# Patient Record
Sex: Female | Born: 1980 | Race: Black or African American | Hispanic: No | Marital: Single | Smoking: Never smoker
Health system: Southern US, Community
[De-identification: ages and names within clinical notes are randomized; demographics above are authoritative.]

## PROBLEM LIST (undated history)

## (undated) DIAGNOSIS — D649 Anemia, unspecified: Secondary | ICD-10-CM

## (undated) DIAGNOSIS — I10 Essential (primary) hypertension: Secondary | ICD-10-CM

## (undated) HISTORY — DX: Anemia, unspecified: D64.9

## (undated) HISTORY — PX: NO PAST SURGERIES: SHX2092

---

## 2000-12-15 ENCOUNTER — Other Ambulatory Visit: Admission: RE | Admit: 2000-12-15 | Discharge: 2000-12-15 | Payer: Self-pay | Admitting: Obstetrics

## 2012-01-26 LAB — OB RESULTS CONSOLE ABO/RH

## 2012-01-26 LAB — OB RESULTS CONSOLE RUBELLA ANTIBODY, IGM: Rubella: IMMUNE

## 2012-01-26 LAB — OB RESULTS CONSOLE ANTIBODY SCREEN: Antibody Screen: NEGATIVE

## 2012-01-26 LAB — OB RESULTS CONSOLE GC/CHLAMYDIA: Gonorrhea: NEGATIVE

## 2012-05-09 LAB — OB RESULTS CONSOLE GBS: GBS: NEGATIVE

## 2012-06-04 ENCOUNTER — Inpatient Hospital Stay (HOSPITAL_COMMUNITY)
Admission: AD | Admit: 2012-06-04 | Discharge: 2012-06-04 | Disposition: A | Discharge status: Hospice | Payer: Medicaid Other | Source: Ambulatory Visit | Attending: Obstetrics & Gynecology | Admitting: Obstetrics & Gynecology

## 2012-06-04 ENCOUNTER — Inpatient Hospital Stay (HOSPITAL_COMMUNITY)
Admission: AD | Admit: 2012-06-04 | Discharge: 2012-06-07 | DRG: 765 | Disposition: A | Discharge status: Home / Self Care | Payer: Medicaid Other | Source: Ambulatory Visit | Attending: Obstetrics & Gynecology | Admitting: Obstetrics & Gynecology

## 2012-06-04 ENCOUNTER — Encounter (HOSPITAL_COMMUNITY): Payer: Self-pay

## 2012-06-04 DIAGNOSIS — O368390 Maternal care for abnormalities of the fetal heart rate or rhythm, unspecified trimester, not applicable or unspecified: Secondary | ICD-10-CM | POA: Insufficient documentation

## 2012-06-04 DIAGNOSIS — O34219 Maternal care for unspecified type scar from previous cesarean delivery: Secondary | ICD-10-CM

## 2012-06-04 DIAGNOSIS — O339 Maternal care for disproportion, unspecified: Secondary | ICD-10-CM | POA: Diagnosis present

## 2012-06-04 DIAGNOSIS — O1002 Pre-existing essential hypertension complicating childbirth: Secondary | ICD-10-CM | POA: Diagnosis present

## 2012-06-04 DIAGNOSIS — O330 Maternal care for disproportion due to deformity of maternal pelvic bones: Secondary | ICD-10-CM | POA: Diagnosis present

## 2012-06-04 HISTORY — DX: Essential (primary) hypertension: I10

## 2012-06-04 LAB — CBC
MCHC: 35.2 g/dL (ref 30.0–36.0)
MCV: 84.9 fL (ref 78.0–100.0)
Platelets: 216 10*3/uL (ref 150–400)
RDW: 13.1 % (ref 11.5–15.5)
WBC: 7.4 10*3/uL (ref 4.0–10.5)

## 2012-06-04 MED ORDER — LACTATED RINGERS IV SOLN
INTRAVENOUS | Status: DC
  Administered 2012-06-04 – 2012-06-05 (×4): via INTRAVENOUS

## 2012-06-04 MED ORDER — OXYTOCIN BOLUS FROM INFUSION: 500.0000 mL | INTRAVENOUS | Status: DC

## 2012-06-04 MED ORDER — IBUPROFEN 600 MG PO TABS: 600.0000 mg | ORAL_TABLET | Freq: Four times a day (QID) | ORAL | Status: DC | PRN

## 2012-06-04 MED ORDER — ONDANSETRON HCL 4 MG/2ML IJ SOLN: 4.0000 mg | Freq: Four times a day (QID) | INTRAMUSCULAR | Status: DC | PRN

## 2012-06-04 MED ORDER — OXYTOCIN 40 UNITS IN LACTATED RINGERS INFUSION - SIMPLE MED
62.5000 mL/h | INTRAVENOUS | Status: DC
  Filled 2012-06-04: qty 1000

## 2012-06-04 MED ORDER — LACTATED RINGERS IV SOLN
500.0000 mL | INTRAVENOUS | Status: DC | PRN
  Administered 2012-06-04 – 2012-06-05 (×2): 500 mL via INTRAVENOUS
  Administered 2012-06-05: 1000 mL via INTRAVENOUS

## 2012-06-04 MED ORDER — OXYCODONE-ACETAMINOPHEN 5-325 MG PO TABS: 1.0000 | ORAL_TABLET | ORAL | Status: DC | PRN

## 2012-06-04 MED ORDER — ACETAMINOPHEN 325 MG PO TABS: 650.0000 mg | ORAL_TABLET | ORAL | Status: DC | PRN

## 2012-06-04 MED ORDER — CITRIC ACID-SODIUM CITRATE 334-500 MG/5ML PO SOLN
30.0000 mL | ORAL | Status: DC | PRN
  Administered 2012-06-05: 30 mL via ORAL
  Filled 2012-06-04: qty 15

## 2012-06-04 MED ORDER — LABETALOL HCL 200 MG PO TABS
200.0000 mg | ORAL_TABLET | Freq: Two times a day (BID) | ORAL | Status: DC
  Administered 2012-06-04 – 2012-06-05 (×2): 200 mg via ORAL
  Filled 2012-06-04 (×4): qty 1

## 2012-06-04 MED ORDER — LIDOCAINE HCL (PF) 1 % IJ SOLN: 30.0000 mL | INTRAMUSCULAR | Status: DC | PRN

## 2012-06-04 MED ORDER — NALBUPHINE SYRINGE 5 MG/0.5 ML: 10.0000 mg | INJECTION | INTRAMUSCULAR | Status: DC | PRN

## 2012-06-04 NOTE — MAU Note (Signed)
Patient was seen in the office and sent to MAU for continued monitoring. Had a nonreactive NST in the office. Patient reports fetal movement, no bleeding, contractions or leaking.

## 2012-06-04 NOTE — H&P (Signed)
Pt is a 31 year old black female, G1P0, at [redacted] weeks EGA who presents to L&D for an induction secondary to chronic hypertension with a minimally reactive NST.  Pt did have a BPP of 8/8 today. Pt had late [facility]. She has been on Labetalol 200mg  BID.GBS-, Normal -OGTT PMHX- see [facility] PE: 160/82    HEENT-wnl  ABD- gravid, palp contractions  cX-50/4/-2 VTX   IMP/ IUP at term         Chronic HTN         Susp. NST PLAN/ Admit            Start Induction

## 2012-06-04 NOTE — MAU Note (Signed)
Pt had 8/8 BPP  This afternoon and was sent for additional monitoring

## 2012-06-04 NOTE — MAU Note (Deleted)
Patient sent from the office for monitoring and Betamethasone injections. Patient states she was 3cm in the office. Patient states she has been having some mild "Braxton Hicks" contractions and some back pain, Reports good fetal movement, no bleeding or leaking

## 2012-06-05 ENCOUNTER — Encounter (HOSPITAL_COMMUNITY): Payer: Self-pay | Admitting: *Deleted

## 2012-06-05 ENCOUNTER — Inpatient Hospital Stay (HOSPITAL_COMMUNITY): Payer: Medicaid Other | Admitting: Anesthesiology

## 2012-06-05 ENCOUNTER — Encounter (HOSPITAL_COMMUNITY)
Admission: AD | Disposition: A | Discharge status: Home / Self Care | Payer: Self-pay | Source: Ambulatory Visit | Attending: Obstetrics & Gynecology

## 2012-06-05 ENCOUNTER — Encounter (HOSPITAL_COMMUNITY): Payer: Self-pay | Admitting: Anesthesiology

## 2012-06-05 LAB — TYPE AND SCREEN
ABO/RH(D): B NEG
Antibody Screen: NEGATIVE

## 2012-06-05 SURGERY — Surgical Case
Anesthesia: Epidural | Site: Abdomen | Wound class: Clean Contaminated

## 2012-06-05 MED ORDER — FENTANYL CITRATE 0.05 MG/ML IJ SOLN
INTRAMUSCULAR | Status: AC
  Administered 2012-06-05: 50 ug via INTRAVENOUS
  Filled 2012-06-05: qty 2

## 2012-06-05 MED ORDER — FENTANYL CITRATE 0.05 MG/ML IJ SOLN
25.0000 ug | INTRAMUSCULAR | Status: DC | PRN
  Administered 2012-06-05: 50 ug via INTRAVENOUS

## 2012-06-05 MED ORDER — OXYTOCIN 40 UNITS IN LACTATED RINGERS INFUSION - SIMPLE MED: 62.5000 mL/h | INTRAVENOUS | Status: AC

## 2012-06-05 MED ORDER — CEFAZOLIN SODIUM-DEXTROSE 2-3 GM-% IV SOLR
INTRAVENOUS | Status: AC
  Filled 2012-06-05: qty 50

## 2012-06-05 MED ORDER — NALBUPHINE HCL 10 MG/ML IJ SOLN
5.0000 mg | INTRAMUSCULAR | Status: DC | PRN
  Filled 2012-06-05: qty 1

## 2012-06-05 MED ORDER — DIPHENHYDRAMINE HCL 50 MG/ML IJ SOLN: 25.0000 mg | INTRAMUSCULAR | Status: DC | PRN

## 2012-06-05 MED ORDER — METOCLOPRAMIDE HCL 5 MG/ML IJ SOLN: 10.0000 mg | Freq: Three times a day (TID) | INTRAMUSCULAR | Status: DC | PRN

## 2012-06-05 MED ORDER — PHENYLEPHRINE HCL 10 MG/ML IJ SOLN
INTRAMUSCULAR | Status: DC | PRN
  Administered 2012-06-05 (×2): 80 ug via INTRAVENOUS
  Administered 2012-06-05: 120 ug via INTRAVENOUS
  Administered 2012-06-05: 80 ug via INTRAVENOUS

## 2012-06-05 MED ORDER — PHENYLEPHRINE 40 MCG/ML (10ML) SYRINGE FOR IV PUSH (FOR BLOOD PRESSURE SUPPORT)
PREFILLED_SYRINGE | INTRAVENOUS | Status: AC
  Filled 2012-06-05: qty 5

## 2012-06-05 MED ORDER — NALOXONE HCL 1 MG/ML IJ SOLN
1.0000 ug/kg/h | INTRAVENOUS | Status: DC | PRN
  Filled 2012-06-05: qty 2

## 2012-06-05 MED ORDER — SODIUM BICARBONATE 8.4 % IV SOLN
INTRAVENOUS | Status: AC
  Filled 2012-06-05: qty 50

## 2012-06-05 MED ORDER — DIPHENHYDRAMINE HCL 25 MG PO CAPS: 25.0000 mg | ORAL_CAPSULE | ORAL | Status: DC | PRN

## 2012-06-05 MED ORDER — LIDOCAINE-EPINEPHRINE (PF) 2 %-1:200000 IJ SOLN
INTRAMUSCULAR | Status: AC
  Filled 2012-06-05: qty 20

## 2012-06-05 MED ORDER — PRENATAL MULTIVITAMIN CH
1.0000 | ORAL_TABLET | Freq: Every day | ORAL | Status: DC
  Administered 2012-06-06 – 2012-06-07 (×2): 1 via ORAL
  Filled 2012-06-05 (×2): qty 1

## 2012-06-05 MED ORDER — SODIUM BICARBONATE 8.4 % IV SOLN
INTRAVENOUS | Status: DC | PRN
  Administered 2012-06-05: 5 mL via EPIDURAL

## 2012-06-05 MED ORDER — DIBUCAINE 1 % RE OINT: 1.0000 "application " | TOPICAL_OINTMENT | RECTAL | Status: DC | PRN

## 2012-06-05 MED ORDER — LIDOCAINE HCL (PF) 1 % IJ SOLN
INTRAMUSCULAR | Status: DC | PRN
  Administered 2012-06-05 (×4): 4 mL

## 2012-06-05 MED ORDER — MORPHINE SULFATE 0.5 MG/ML IJ SOLN
INTRAMUSCULAR | Status: AC
  Filled 2012-06-05: qty 10

## 2012-06-05 MED ORDER — TERBUTALINE SULFATE 1 MG/ML IJ SOLN: 0.2500 mg | Freq: Once | INTRAMUSCULAR | Status: DC | PRN

## 2012-06-05 MED ORDER — ZOLPIDEM TARTRATE 5 MG PO TABS: 5.0000 mg | ORAL_TABLET | Freq: Every evening | ORAL | Status: DC | PRN

## 2012-06-05 MED ORDER — MEASLES, MUMPS & RUBELLA VAC ~~LOC~~ INJ
0.5000 mL | INJECTION | Freq: Once | SUBCUTANEOUS | Status: DC
  Filled 2012-06-05: qty 0.5

## 2012-06-05 MED ORDER — EPHEDRINE 5 MG/ML INJ
10.0000 mg | INTRAVENOUS | Status: DC | PRN
  Filled 2012-06-05: qty 4

## 2012-06-05 MED ORDER — SENNOSIDES-DOCUSATE SODIUM 8.6-50 MG PO TABS
2.0000 | ORAL_TABLET | Freq: Every day | ORAL | Status: DC
  Administered 2012-06-05 – 2012-06-06 (×2): 2 via ORAL

## 2012-06-05 MED ORDER — KETOROLAC TROMETHAMINE 30 MG/ML IJ SOLN
30.0000 mg | Freq: Four times a day (QID) | INTRAMUSCULAR | Status: AC | PRN
  Administered 2012-06-05: 30 mg via INTRAVENOUS

## 2012-06-05 MED ORDER — 0.9 % SODIUM CHLORIDE (POUR BTL) OPTIME
TOPICAL | Status: DC | PRN
  Administered 2012-06-05: 1000 mL

## 2012-06-05 MED ORDER — LABETALOL HCL 200 MG PO TABS
200.0000 mg | ORAL_TABLET | Freq: Two times a day (BID) | ORAL | Status: DC
  Administered 2012-06-05 – 2012-06-06 (×3): 200 mg via ORAL
  Filled 2012-06-05 (×6): qty 1

## 2012-06-05 MED ORDER — KETOROLAC TROMETHAMINE 30 MG/ML IJ SOLN
INTRAMUSCULAR | Status: AC
  Filled 2012-06-05: qty 1

## 2012-06-05 MED ORDER — IBUPROFEN 600 MG PO TABS
600.0000 mg | ORAL_TABLET | Freq: Four times a day (QID) | ORAL | Status: DC
  Administered 2012-06-05 – 2012-06-07 (×6): 600 mg via ORAL

## 2012-06-05 MED ORDER — OXYTOCIN 40 UNITS IN LACTATED RINGERS INFUSION - SIMPLE MED
1.0000 m[IU]/min | INTRAVENOUS | Status: DC
  Administered 2012-06-05: 1 m[IU]/min via INTRAVENOUS

## 2012-06-05 MED ORDER — OXYCODONE-ACETAMINOPHEN 5-325 MG PO TABS
1.0000 | ORAL_TABLET | ORAL | Status: DC | PRN
  Administered 2012-06-07: 2 via ORAL
  Administered 2012-06-07: 1 via ORAL
  Filled 2012-06-05: qty 2
  Filled 2012-06-05: qty 1

## 2012-06-05 MED ORDER — TETANUS-DIPHTH-ACELL PERTUSSIS 5-2.5-18.5 LF-MCG/0.5 IM SUSP: 0.5000 mL | Freq: Once | INTRAMUSCULAR | Status: DC

## 2012-06-05 MED ORDER — NALOXONE HCL 0.4 MG/ML IJ SOLN: 0.4000 mg | INTRAMUSCULAR | Status: DC | PRN

## 2012-06-05 MED ORDER — LACTATED RINGERS IV SOLN
INTRAVENOUS | Status: DC
  Administered 2012-06-05: 20:00:00 via INTRAVENOUS

## 2012-06-05 MED ORDER — DIPHENHYDRAMINE HCL 50 MG/ML IJ SOLN: 12.5000 mg | INTRAMUSCULAR | Status: DC | PRN

## 2012-06-05 MED ORDER — LACTATED RINGERS IV SOLN
500.0000 mL | Freq: Once | INTRAVENOUS | Status: AC
  Administered 2012-06-05: 500 mL via INTRAVENOUS

## 2012-06-05 MED ORDER — ONDANSETRON HCL 4 MG PO TABS: 4.0000 mg | ORAL_TABLET | ORAL | Status: DC | PRN

## 2012-06-05 MED ORDER — PRENATAL MULTIVITAMIN CH: 1.0000 | ORAL_TABLET | Freq: Every day | ORAL | Status: DC

## 2012-06-05 MED ORDER — FENTANYL 2.5 MCG/ML BUPIVACAINE 1/10 % EPIDURAL INFUSION (WH - ANES)
14.0000 mL/h | INTRAMUSCULAR | Status: DC
  Administered 2012-06-05 (×2): 14 mL/h via EPIDURAL
  Filled 2012-06-05 (×2): qty 125

## 2012-06-05 MED ORDER — IBUPROFEN 600 MG PO TABS
600.0000 mg | ORAL_TABLET | Freq: Four times a day (QID) | ORAL | Status: DC | PRN
  Administered 2012-06-07: 600 mg via ORAL
  Filled 2012-06-05 (×7): qty 1

## 2012-06-05 MED ORDER — KETOROLAC TROMETHAMINE 30 MG/ML IJ SOLN: 30.0000 mg | Freq: Four times a day (QID) | INTRAMUSCULAR | Status: AC | PRN

## 2012-06-05 MED ORDER — PHENYLEPHRINE 40 MCG/ML (10ML) SYRINGE FOR IV PUSH (FOR BLOOD PRESSURE SUPPORT): 80.0000 ug | PREFILLED_SYRINGE | INTRAVENOUS | Status: DC | PRN

## 2012-06-05 MED ORDER — WITCH HAZEL-GLYCERIN EX PADS: 1.0000 "application " | MEDICATED_PAD | CUTANEOUS | Status: DC | PRN

## 2012-06-05 MED ORDER — ONDANSETRON HCL 4 MG/2ML IJ SOLN
4.0000 mg | Freq: Three times a day (TID) | INTRAMUSCULAR | Status: DC | PRN
  Filled 2012-06-05: qty 2

## 2012-06-05 MED ORDER — MEPERIDINE HCL 25 MG/ML IJ SOLN: 6.2500 mg | INTRAMUSCULAR | Status: DC | PRN

## 2012-06-05 MED ORDER — ONDANSETRON HCL 4 MG/2ML IJ SOLN
4.0000 mg | INTRAMUSCULAR | Status: DC | PRN
  Administered 2012-06-05: 4 mg via INTRAVENOUS

## 2012-06-05 MED ORDER — ONDANSETRON HCL 4 MG/2ML IJ SOLN
INTRAMUSCULAR | Status: DC | PRN
  Administered 2012-06-05: 4 mg via INTRAVENOUS

## 2012-06-05 MED ORDER — SODIUM CHLORIDE 0.9 % IJ SOLN: 3.0000 mL | INTRAMUSCULAR | Status: DC | PRN

## 2012-06-05 MED ORDER — CEFAZOLIN SODIUM-DEXTROSE 2-3 GM-% IV SOLR
INTRAVENOUS | Status: DC | PRN
  Administered 2012-06-05: 2 g via INTRAVENOUS

## 2012-06-05 MED ORDER — SCOPOLAMINE 1 MG/3DAYS TD PT72: 1.0000 | MEDICATED_PATCH | Freq: Once | TRANSDERMAL | Status: DC

## 2012-06-05 MED ORDER — MORPHINE SULFATE (PF) 0.5 MG/ML IJ SOLN
INTRAMUSCULAR | Status: DC | PRN
  Administered 2012-06-05: 3 mg via EPIDURAL

## 2012-06-05 MED ORDER — OXYTOCIN 10 UNIT/ML IJ SOLN
40.0000 [IU] | INTRAVENOUS | Status: DC | PRN
  Administered 2012-06-05: 40 [IU] via INTRAVENOUS

## 2012-06-05 MED ORDER — EPHEDRINE 5 MG/ML INJ: 10.0000 mg | INTRAVENOUS | Status: DC | PRN

## 2012-06-05 MED ORDER — PHENYLEPHRINE 40 MCG/ML (10ML) SYRINGE FOR IV PUSH (FOR BLOOD PRESSURE SUPPORT)
80.0000 ug | PREFILLED_SYRINGE | INTRAVENOUS | Status: DC | PRN
  Filled 2012-06-05: qty 5

## 2012-06-05 MED ORDER — RHO D IMMUNE GLOBULIN 1500 UNIT/2ML IJ SOLN
300.0000 ug | Freq: Once | INTRAMUSCULAR | Status: DC
Start: 2012-06-06 — End: 2012-06-07
  Filled 2012-06-05: qty 2

## 2012-06-05 MED ORDER — SCOPOLAMINE 1 MG/3DAYS TD PT72
MEDICATED_PATCH | TRANSDERMAL | Status: AC
  Administered 2012-06-05: 1.5 mg via TRANSDERMAL
  Filled 2012-06-05: qty 1

## 2012-06-05 SURGICAL SUPPLY — 29 items
CLOTH BEACON ORANGE TIMEOUT ST (SAFETY) ×2 IMPLANT
CONTAINER PREFILL 10% NBF 15ML (MISCELLANEOUS) IMPLANT
DRSG COVADERM 4X10 (GAUZE/BANDAGES/DRESSINGS) ×1 IMPLANT
DURAPREP 26ML APPLICATOR (WOUND CARE) ×2 IMPLANT
ELECT REM PT RETURN 9FT ADLT (ELECTROSURGICAL) ×2
ELECTRODE REM PT RTRN 9FT ADLT (ELECTROSURGICAL) ×1 IMPLANT
EXTRACTOR VACUUM M CUP 4 TUBE (SUCTIONS) IMPLANT
GLOVE ECLIPSE 7.0 STRL STRAW (GLOVE) ×4 IMPLANT
GOWN PREVENTION PLUS LG XLONG (DISPOSABLE) ×2 IMPLANT
GOWN PREVENTION PLUS XLARGE (GOWN DISPOSABLE) ×2 IMPLANT
KIT ABG SYR 3ML LUER SLIP (SYRINGE) IMPLANT
NDL HYPO 25X5/8 SAFETYGLIDE (NEEDLE) IMPLANT
NEEDLE HYPO 25X5/8 SAFETYGLIDE (NEEDLE) IMPLANT
NS IRRIG 1000ML POUR BTL (IV SOLUTION) ×2 IMPLANT
PACK C SECTION WH (CUSTOM PROCEDURE TRAY) ×2 IMPLANT
PAD OB MATERNITY 4.3X12.25 (PERSONAL CARE ITEMS) ×1 IMPLANT
RETRACTOR WND ALEXIS 25 LRG (MISCELLANEOUS) IMPLANT
RETRACTOR WOUND ALXS 34CM XLRG (MISCELLANEOUS) IMPLANT
RTRCTR WOUND ALEXIS 25CM LRG (MISCELLANEOUS)
RTRCTR WOUND ALEXIS 34CM XLRG (MISCELLANEOUS)
SLEEVE SCD COMPRESS KNEE MED (MISCELLANEOUS) ×1 IMPLANT
STAPLER VISISTAT 35W (STAPLE) IMPLANT
SUT MNCRL 0 VIOLET CTX 36 (SUTURE) ×3 IMPLANT
SUT MON AB 2-0 CT1 27 (SUTURE) ×4 IMPLANT
SUT MONOCRYL 0 CTX 36 (SUTURE) ×4
SUT PLAIN 0 NONE (SUTURE) IMPLANT
TOWEL OR 17X24 6PK STRL BLUE (TOWEL DISPOSABLE) ×4 IMPLANT
TRAY FOLEY CATH 14FR (SET/KITS/TRAYS/PACK) IMPLANT
WATER STERILE IRR 1000ML POUR (IV SOLUTION) ×1 IMPLANT

## 2012-06-05 NOTE — Consult Note (Addendum)
Neonatology Note:  Attendance at C-section:  I was asked to attend this primary C/S at term due to NRFHR. The mother is a G1P0 B neg, GBS neg with late PNC, chronic HTN on labetalol, a minimally-reactive NST, and some FHR decelerations during labor.. ROM 16 hours prior to delivery, fluid clear. Infant vigorous with good spontaneous cry and tone. Needed only minimal bulb suctioning. Ap 8/9. Lungs clear to ausc in DR. To CN to care of Pediatrician.  Jorell Agne, MD  

## 2012-06-05 NOTE — Progress Notes (Signed)
Pt has been pushing for . No change in station, still -3. Occ late decel. Resolved with poistion changes and O2. Will recheck in 1 hour unless there are FHT issues.

## 2012-06-05 NOTE — Anesthesia Postprocedure Evaluation (Signed)
  Anesthesia Post-op Note  Patient: [name]  Procedure(s) Performed: Procedure(s) (LRB) with comments: CESAREAN SECTION (N/A)  Patient Location: PACU  Anesthesia Type:Epidural  Level of Consciousness: awake, alert  and oriented  Airway and Oxygen Therapy: Patient Spontanous Breathing  Post-op Pain: none  Post-op Assessment: Post-op Vital signs reviewed, Patient's Cardiovascular Status Stable, Respiratory Function Stable, Patent Airway, No signs of Nausea or vomiting, Pain level controlled, No headache, No backache, No residual numbness and No residual motor weakness  Post-op Vital Signs: Reviewed and stable  Complications: No apparent anesthesia complications

## 2012-06-05 NOTE — Transfer of Care (Signed)
Immediate Anesthesia Transfer of Care Note  Patient: [name]  Procedure(s) Performed: Procedure(s) (LRB) with comments: CESAREAN SECTION (N/A)  Patient Location: PACU  Anesthesia Type:Spinal  Level of Consciousness: awake, alert  and oriented  Airway & Oxygen Therapy: Patient Spontanous Breathing  Post-op Assessment: Report given to PACU RN and Post -op Vital signs reviewed and stable  Post vital signs: stable  Complications: No apparent anesthesia complications

## 2012-06-05 NOTE — Progress Notes (Signed)
Pt has an epidural. Has no complaints.  She is now 9cm. FHTs- minimal variability,still reassuring, no change since admission, no decels PLAN/ will recheck in 1 hour.

## 2012-06-05 NOTE — Anesthesia Procedure Notes (Signed)
Epidural Patient location during procedure: OB Start time: 06/05/2012 1:34 AM  Staffing Performed by: anesthesiologist   Preanesthetic Checklist Completed: patient identified, site marked, surgical consent, pre-op evaluation, timeout performed, IV checked, risks and benefits discussed and monitors and equipment checked  Epidural Patient position: sitting Prep: site prepped and draped and DuraPrep Patient monitoring: continuous pulse ox and blood pressure Approach: midline Injection technique: LOR air  Needle:  Needle type: Tuohy  Needle gauge: 17 G Needle length: 9 cm and 9 Needle insertion depth: 7 cm Catheter type: closed end flexible Catheter size: 19 Gauge Catheter at skin depth: 12 cm Test dose: negative  Assessment Events: blood not aspirated, injection not painful, no injection resistance, negative IV test and no paresthesia  Additional Notes Discussed risk of headache, infection, bleeding, nerve injury and failed or incomplete block.  Patient voices understanding and wishes to proceed. Reason for block:procedure for pain

## 2012-06-05 NOTE — Anesthesia Preprocedure Evaluation (Addendum)
Anesthesia Evaluation  Patient identified by MRN, date of birth, ID band Patient awake    Reviewed: Allergy & Precautions, H&P , NPO status , Patient's Chart, lab work & pertinent test results, reviewed documented beta blocker date and time   History of Anesthesia Complications Negative for: history of anesthetic complications  Airway Mallampati: III TM Distance: >3 FB Neck ROM: full    Dental  (+) Teeth Intact   Pulmonary neg pulmonary ROS,  breath sounds clear to auscultation        Cardiovascular hypertension, On Home Beta Blockers Rhythm:regular Rate:Normal     Neuro/Psych negative neurological ROS  negative psych ROS   GI/Hepatic negative GI ROS, Neg liver ROS,   Endo/Other  Morbid obesity  Renal/GU negative Renal ROS     Musculoskeletal   Abdominal   Peds  Hematology negative hematology ROS (+)   Anesthesia Other Findings   Reproductive/Obstetrics (+) Pregnancy                           Anesthesia Physical Anesthesia Plan  ASA: III and emergent  Anesthesia Plan: Epidural   Post-op Pain Management:    Induction:   Airway Management Planned:   Additional Equipment:   Intra-op Plan:   Post-operative Plan:   Informed Consent: I have reviewed the patients History and Physical, chart, labs and discussed the procedure including the risks, benefits and alternatives for the proposed anesthesia with the patient or authorized representative who has indicated his/her understanding and acceptance.   Dental advisory given  Plan Discussed with: CRNA, Anesthesiologist and Surgeon  Anesthesia Plan Comments:        Anesthesia Quick Evaluation

## 2012-06-06 ENCOUNTER — Encounter (HOSPITAL_COMMUNITY): Payer: Self-pay | Admitting: Obstetrics and Gynecology

## 2012-06-06 LAB — CBC
HCT: 31.4 % — ABNORMAL LOW (ref 36.0–46.0)
MCH: 29.8 pg (ref 26.0–34.0)
MCV: 85.8 fL (ref 78.0–100.0)
Platelets: 191 10*3/uL (ref 150–400)
RBC: 3.66 MIL/uL — ABNORMAL LOW (ref 3.87–5.11)
RDW: 13.3 % (ref 11.5–15.5)
WBC: 15.4 10*3/uL — ABNORMAL HIGH (ref 4.0–10.5)

## 2012-06-06 MED ORDER — RHO D IMMUNE GLOBULIN 1500 UNIT/2ML IJ SOLN
300.0000 ug | Freq: Once | INTRAMUSCULAR | Status: AC
  Administered 2012-06-06: 300 ug via INTRAMUSCULAR
  Filled 2012-06-06: qty 2

## 2012-06-06 NOTE — Addendum Note (Signed)
Addendum  created 06/06/12 0937 by Natalyn Szymanowski S Bonniejean Piano, CRNA   Modules edited:Notes Section    

## 2012-06-06 NOTE — Progress Notes (Signed)
UR chart review completed.  

## 2012-06-06 NOTE — Progress Notes (Signed)
Late entry Patient is eating, ambulating.  Pain control is good.  Appropriate lochia.  No complaints.  Filed Vitals:   06/06/12 0545 06/06/12 1017 06/06/12 1345 06/06/12 1500  BP: 127/61 118/72 122/76 118/74  Pulse: 93 80 82 77  Temp: 98.4 F (36.9 C) 98.2 F (36.8 C) 98.4 F (36.9 C) 98.2 F (36.8 C)  TempSrc: Oral Oral    Resp: 18 20 20 18   Height:      Weight:      SpO2: 94% 97% 98% 97%    [employer]: c/d/i Ext: no CT Foley in place at time of exam.  Lab Results  Component Value Date   WBC 15.4* 06/06/2012   HGB 10.9* 06/06/2012   HCT 31.4* 06/06/2012   MCV 85.8 06/06/2012   PLT 191 06/06/2012    --/--/B NEG (11/20 0515)  A/P Post op day 1 s/p c/x for NRFHT Pt stable doing well.  Routine care.    Philip Aspen

## 2012-06-06 NOTE — Addendum Note (Signed)
Addendum  created 06/06/12 0933 by Elbert Ewings, CRNA   Modules edited:Notes Section

## 2012-06-06 NOTE — Op Note (Signed)
[name], [name]               ACCOUNT NO.:  [id number]  MEDICAL RECORD NO.:  [id number]  LOCATION:  9135                          FACILITY:  [facility]  PHYSICIAN:  Malva Limes, M.D.    DATE OF BIRTH:  [birth year]  DATE OF PROCEDURE: DATE OF DISCHARGE:                              OPERATIVE REPORT   PREOPERATIVE DIAGNOSES: 1. Intrauterine pregnancy at term. 2. Nonreassuring fetal heart rate tracing. 3. Cephalopelvic disproportion.  POSTOPERATIVE DIAGNOSES: 1. Intrauterine pregnancy at term. 2. Nonreassuring fetal heart rate tracing. 3. Cephalopelvic disproportion.  PROCEDURE:  Primary low transverse cesarean section.  SURGEON:  Malva Limes, M.D.  ANESTHESIA:  Epidural.  ANTIBIOTICS:  Ancef 2 g.  DRAINS:  Foley bedside drainage.  ESTIMATED BLOOD LOSS:  900 mL.  SPECIMENS:  Placenta sent to pathology.  COMPLICATIONS:  None.  FINDINGS:  The patient had normal fallopian tubes and ovaries bilaterally.  The uterus appeared to be normal.  The patient delivered 1 live viable black female infant in the vertex presentation.  The placenta appeared to be normal.  INDICATIONS:  [name] is a 31 year old, black female, who was admitted for induction at 39 weeks because of minimally reactive nonstress test the day prior to admission.  The patient's pregnancy was complicated by chronic hypertension.  She took labetalol 200 mg b.i.d. throughout the pregnancy.  On admission, the patient's cervix was 4 cm.  She underwent an amniotomy with clear fluid.  The patient completed the 1st stage of labor without complications.  Her tracing continued to have minimal variability and occasional late decelerations, but was still reassuring. The patient pushed for 2 hours and 15 minutes.  She failed to move the head beyond -2 station and the late decelerations were worsening.  In light of this, the patient was taken to the operating room for primary cesarean section.  DESCRIPTION OF  PROCEDURE:  The patient was taken to the operating room where epidural anesthetic was reinjected.  Once adequate level was reached, she was prepped and draped in the usual fashion for this procedure.  A Foley catheter had been placed in her bladder.  A Pfannenstiel incision was made 2 cm above the pubic symphysis.  On entering the abdominal cavity, the bladder flap was taken down with sharp dissection.  A low-transverse uterine incision was made in the midline and extended laterally with Metzenbaum scissors.  Amniotic fluid was noted to be clear.  The infant was delivered in the vertex presentation.  On delivery of the head, the oropharynx and nostrils were bulb suctioned.  The remaining infant was then delivered.  The cord was doubly clamped and cut and the infant handed to the awaiting NICU team. Placenta was then manually removed.  The uterus was exteriorized.  The uterine cavity was cleaned and wiped the wet lap and inspected.  The uterine incision was closed in a single layer of 0 Monocryl suture in a running, locking fashion.  The bladder flap was closed using 2-0 Monocryl in a running fashion.  Hemostasis was checked and felt to be adequate.  Uterus was placed back in the abdominal cavity.  The parietal peritoneum and rectus muscles were approximated midline using 2-0 Monocryl in  a running fashion.  The fascia was closed using 0 Monocryl suture in a running fashion.  Subcuticular tissue was made hemostatic with the Bovie.  Stainless steel clips were used to close the skin.  The patient tolerated the procedure well.  She was taken to recovery room in stable condition.  Instrument and lap count was correct x3.          ______________________________ Malva Limes, M.D.     MA/MEDQ  D:  06/05/2012  T:  06/06/2012  Job:  [id number]

## 2012-06-06 NOTE — Anesthesia Postprocedure Evaluation (Signed)
  Anesthesia Post-op Note  Patient: [name]  Procedure(s) Performed: Procedure(s) (LRB) with comments: CESAREAN SECTION (N/A)  Patient Location: PACU and Mother/Baby  Anesthesia Type:Epidural  Level of Consciousness: awake, alert  and oriented  Airway and Oxygen Therapy: Patient Spontanous Breathing  Post-op Pain: mild  Post-op Assessment: Patient's Cardiovascular Status Stable, Respiratory Function Stable, No signs of Nausea or vomiting, Adequate PO intake and Pain level controlled  Post-op Vital Signs: stable  Complications: No apparent anesthesia complications

## 2012-06-07 MED ORDER — DOCUSATE SODIUM 100 MG PO CAPS: 100.0000 mg | ORAL_CAPSULE | Freq: Two times a day (BID) | ORAL | Status: DC

## 2012-06-07 MED ORDER — IBUPROFEN 600 MG PO TABS: 600.0000 mg | ORAL_TABLET | Freq: Four times a day (QID) | ORAL | Status: AC | PRN

## 2012-06-07 MED ORDER — OXYCODONE-ACETAMINOPHEN 5-325 MG PO TABS: 2.0000 | ORAL_TABLET | ORAL | Status: DC | PRN

## 2012-06-07 MED ORDER — LABETALOL HCL 100 MG PO TABS: 200.0000 mg | ORAL_TABLET | Freq: Two times a day (BID) | ORAL | Status: DC

## 2012-06-07 NOTE — Addendum Note (Signed)
Addendum  created 06/07/12 1100 by Orlie Pollen, CRNA   Modules edited:Notes Section

## 2012-06-07 NOTE — Anesthesia Postprocedure Evaluation (Signed)
  Anesthesia Post-op Note  Patient: [name]  Procedure(s) Performed: Procedure(s) (LRB) with comments: CESAREAN SECTION (N/A)  Patient Location: PACU and Mother/Baby  Anesthesia Type:MAC and Spinal  Level of Consciousness: awake, alert , oriented and patient cooperative  Airway and Oxygen Therapy: Patient Spontanous Breathing  Post-op Pain: none  Post-op Assessment: Post-op Vital signs reviewed and Patient's Cardiovascular Status Stable  Post-op Vital Signs: Reviewed and stable  Complications: No apparent anesthesia complications

## 2012-06-07 NOTE — Discharge Summary (Signed)
Obstetric Discharge Summary Reason for Admission: onset of labor Prenatal Procedures: NST and ultrasound Intrapartum Procedures: cesarean: low cervical, transverse Postpartum Procedures: none Complications-Operative and Postpartum: none Hemoglobin  Date Value Range Status  06/06/2012 10.9* 12.0 - 15.0 g/dL Final     HCT  Date Value Range Status  06/06/2012 31.4* 36.0 - 46.0 % Final    Physical Exam:  General: alert, cooperative and appears stated age 31: appropriate Uterine Fundus: firm Incision: healing well DVT Evaluation: No evidence of DVT seen on physical exam.  Discharge Diagnoses: Term Pregnancy-delivered and Chronic Hypertension  Discharge Information: Date: 06/07/2012 Activity: pelvic rest Diet: routine Medications: Ibuprofen, Colace, Percocet and Labetalol Condition: improved Instructions: refer to practice specific booklet Discharge to: home Follow-up Information    Follow up with Levi Aland, MD. Schedule an appointment as soon as possible for a visit in 4 weeks. (For a post-operative evaluation)    Contact information:   [address] [place] [address] [phone]          Newborn Data: Live born female  Birth Weight: 7 lb 7.8 oz (3395 g) APGAR: 8, 9  Home with mother.  Katrina Poland H. 06/07/2012, 3:25 PM

## 2012-06-08 LAB — RH IG WORKUP (INCLUDES ABO/RH)
Fetal Screen: NEGATIVE
Unit division: 0

## 2014-05-19 ENCOUNTER — Encounter (HOSPITAL_COMMUNITY): Payer: Self-pay | Admitting: Obstetrics and Gynecology

## 2014-10-17 ENCOUNTER — Emergency Department (HOSPITAL_COMMUNITY)
Admission: EM | Admit: 2014-10-17 | Discharge: 2014-10-17 | Disposition: A | Discharge status: Home / Self Care | Payer: Medicaid Other | Attending: Emergency Medicine | Admitting: Emergency Medicine

## 2014-10-17 ENCOUNTER — Encounter (HOSPITAL_COMMUNITY): Payer: Self-pay | Admitting: Emergency Medicine

## 2014-10-17 DIAGNOSIS — I10 Essential (primary) hypertension: Secondary | ICD-10-CM | POA: Insufficient documentation

## 2014-10-17 DIAGNOSIS — H6121 Impacted cerumen, right ear: Secondary | ICD-10-CM | POA: Insufficient documentation

## 2014-10-17 DIAGNOSIS — Z79899 Other long term (current) drug therapy: Secondary | ICD-10-CM | POA: Insufficient documentation

## 2014-10-17 MED ORDER — CARBAMIDE PEROXIDE 6.5 % OT SOLN: 5.0000 [drp] | Freq: Two times a day (BID) | OTIC | Status: DC | PRN

## 2014-10-17 NOTE — ED Notes (Signed)
States woke this am with right ear "stopped up". Denies pain.

## 2014-10-17 NOTE — ED Provider Notes (Signed)
CSN: [id number]     Arrival date & time 10/17/14  1045 History   This chart was scribed for non-physician practitioner, Trixie Dredge, PA-C, working with Gwyneth Sprout, MD by Charline Bills, ED Scribe. This patient was seen in room TR05C/TR05C and the patient's care was started at 11:13 AM.   Chief Complaint  Patient presents with  . Ear Problem   The history is provided by the patient. No language interpreter was used.   HPI Comments: [name] is a 34 y.o. female, with a h/o HTN, who presents to the Emergency Department complaining of R ear fullness upon waking this morning. Pt states that her hearing is muffled in her R ear. She denies ear discharge, fever, sore throat, congestion, rhinorrhea, cough. Pt has tried a few drops of peroxide with minimal relief. No other alleviating or aggravating factors.   Past Medical History  Diagnosis Date  . Hypertension    Past Surgical History  Procedure Laterality Date  . No past surgeries    . Cesarean section  06/05/2012    Procedure: CESAREAN SECTION;  Surgeon: Levi Aland, MD;  Location: [facility] ORS;  Service: Obstetrics;  Laterality: N/A;   Family History  Problem Relation Age of Onset  . Hypertension Maternal Grandmother   . Hypertension Maternal Grandfather   . Hypertension Paternal Grandmother   . Hypertension Paternal Grandfather    History  Substance Use Topics  . Smoking status: Never Smoker   . Smokeless tobacco: Never Used  . Alcohol Use: No   OB History    Gravida Para Term Preterm AB TAB SAB Ectopic Multiple Living   0 0 0 0 0 0 1     Review of Systems  Constitutional: Negative for fever.  HENT: Negative for congestion, ear discharge, ear pain, rhinorrhea and sore throat.   Respiratory: Negative for cough.    Allergies  Review of patient's allergies indicates no known allergies.  Home Medications   Prior to Admission medications   Medication Sig Start Date End Date Taking? Authorizing Provider   docusate sodium (COLACE) 100 MG capsule Take 1 capsule (100 mg total) by mouth 2 (two) times daily. 06/07/12   Waynard Reeds, MD  ibuprofen (ADVIL,MOTRIN) 600 MG tablet Take 1 tablet (600 mg total) by mouth every 6 (six) hours as needed for pain. 06/07/12   Waynard Reeds, MD  labetalol (NORMODYNE) 100 MG tablet Take 2 tablets (200 mg total) by mouth 2 (two) times daily. 06/07/12   Waynard Reeds, MD  labetalol (NORMODYNE) 200 MG tablet Take 200 mg by mouth 2 (two) times daily.    Historical Provider, MD  oxyCODONE-acetaminophen (ROXICET) 5-325 MG per tablet Take 2 tablets by mouth every 4 (four) hours as needed for pain. May take 1-2 tablets every 4-6 hours as needed for pain 06/07/12   Waynard Reeds, MD  Prenatal Vit-Fe Fumarate-FA (PRENATAL MULTIVITAMIN) TABS Take 1 tablet by mouth daily.    Historical Provider, MD   BP 165/89 mmHg  Pulse 99  Temp(Src) 98.7 F (37.1 C) (Oral)  Resp 16  Ht  (1.575 m)  Wt 201 lb 1 oz (91.201 kg)  BMI 36.77 kg/m2  SpO2 100%  LMP 09/27/2014 (Exact Date) Physical Exam  Constitutional: She appears well-developed and well-nourished. No distress.  HENT:  Head: Normocephalic and atraumatic.  Left Ear: Tympanic membrane and ear canal normal.  R canal has cerumen impaction.  Neck: Neck supple.  Pulmonary/Chest: Effort normal.  Neurological: She is alert.  Skin: She is not diaphoretic.  Nursing note and vitals reviewed.  ED Course  Procedures (including critical care time) DIAGNOSTIC STUDIES: Oxygen Saturation is 100% on RA, normal by my interpretation.    COORDINATION OF CARE: 11:15 AM-Discussed treatment plan which includes ear irrigation with pt at bedside and pt agreed to plan.   Labs Review Labs Reviewed - No data to display  Imaging Review No results found.   EKG Interpretation None       Ear canal now completely clear after irrigation.  Right TM normal.  MDM   Final diagnoses:  Cerumen impaction, right    Afebrile, nontoxic patient  with simple cerumen impaction, easily irrigated with complete relief of symptoms.  No e/o infection.   D/C home with PRN debrox.  PCP follow up PRN.  Discussed result, findings, treatment, and follow up  with patient.  Pt given return precautions.  Pt verbalizes understanding and agrees with plan.       I personally performed the services described in this documentation, which was scribed in my presence. The recorded information has been reviewed and is accurate.    Trixie Dredgemily Josephene Marrone, PA-C 10/17/14 1230  Gwyneth SproutWhitney Plunkett, MD 10/18/14 [phone]

## 2014-10-17 NOTE — Discharge Instructions (Signed)
,  Read the information below.  Use the prescribed medication as directed.  Please discuss all new medications with your pharmacist.  You may return to the Emergency Department at any time for worsening condition or any new symptoms that concern you.  If you develop fevers, uncontrolled ear pain, bleeding or discharge from your ear, see your doctor or return for a recheck.      Cerumen Impaction A cerumen impaction is when the wax in your ear forms a plug. This plug usually causes reduced hearing. Sometimes it also causes an earache or dizziness. Removing a cerumen impaction can be difficult and painful. The wax sticks to the ear canal. The canal is sensitive and bleeds easily. If you try to remove a heavy wax buildup with a cotton tipped swab, you may push it in further. Irrigation with water, suction, and small ear curettes may be used to clear out the wax. If the impaction is fixed to the skin in the ear canal, ear drops may be needed for a few days to loosen the wax. People who build up a lot of wax frequently can use ear wax removal products available in your local drugstore. SEEK MEDICAL CARE IF:  You develop an earache, increased hearing loss, or marked dizziness. Document Released: 08/11/2004 Document Revised: 09/26/2011 Document Reviewed: 10/01/2009 [facility] Patient Information 2015 [employer] [place]. This information is not intended to replace advice given to you by your health care provider. Make sure you discuss any questions you have with your health care provider.

## 2016-01-10 ENCOUNTER — Emergency Department (HOSPITAL_COMMUNITY)
Admission: EM | Admit: 2016-01-10 | Discharge: 2016-01-11 | Disposition: A | Discharge status: Home / Self Care | Payer: BLUE CROSS/BLUE SHIELD | Attending: Emergency Medicine | Admitting: Emergency Medicine

## 2016-01-10 ENCOUNTER — Emergency Department (HOSPITAL_COMMUNITY): Payer: BLUE CROSS/BLUE SHIELD

## 2016-01-10 ENCOUNTER — Encounter (HOSPITAL_COMMUNITY): Payer: Self-pay

## 2016-01-10 ENCOUNTER — Other Ambulatory Visit: Payer: Self-pay

## 2016-01-10 DIAGNOSIS — I10 Essential (primary) hypertension: Secondary | ICD-10-CM | POA: Diagnosis not present

## 2016-01-10 DIAGNOSIS — R000 Tachycardia, unspecified: Secondary | ICD-10-CM | POA: Diagnosis not present

## 2016-01-10 DIAGNOSIS — R079 Chest pain, unspecified: Secondary | ICD-10-CM | POA: Diagnosis present

## 2016-01-10 NOTE — ED Notes (Signed)
Onset last night intermittant pain underneath right breast.  Pt had headache yesterday and vomited x 1.  NAD at triage.

## 2016-01-11 LAB — BASIC METABOLIC PANEL
ANION GAP: 9 (ref 5–15)
BUN: 9 mg/dL (ref 6–20)
CHLORIDE: 103 mmol/L (ref 101–111)
CO2: 25 mmol/L (ref 22–32)
Calcium: 9.4 mg/dL (ref 8.9–10.3)
Creatinine, Ser: 0.74 mg/dL (ref 0.44–1.00)
GFR calc non Af Amer: >60 mL/min (ref >60)
Glucose, Bld: 103 mg/dL — ABNORMAL HIGH (ref 65–99)
POTASSIUM: 2.9 mmol/L — AB (ref 3.5–5.1)
SODIUM: 137 mmol/L (ref 135–145)

## 2016-01-11 LAB — CBC WITH DIFFERENTIAL/PLATELET
BASOS PCT: 1 %
Basophils Absolute: 0 10*3/uL (ref 0.0–0.1)
EOS ABS: 0 10*3/uL (ref 0.0–0.7)
Eosinophils Relative: 1 %
HEMATOCRIT: 36.6 % (ref 36.0–46.0)
HEMOGLOBIN: 12.2 g/dL (ref 12.0–15.0)
LYMPHS ABS: 2.5 10*3/uL (ref 0.7–4.0)
Lymphocytes Relative: 39 %
MCH: 26.7 pg (ref 26.0–34.0)
MCHC: 33.3 g/dL (ref 30.0–36.0)
MCV: 80.1 fL (ref 78.0–100.0)
Monocytes Absolute: 0.5 10*3/uL (ref 0.1–1.0)
Monocytes Relative: 8 %
NEUTROS ABS: 3.3 10*3/uL (ref 1.7–7.7)
NEUTROS PCT: 51 %
Platelets: 274 10*3/uL (ref 150–400)
RBC: 4.57 MIL/uL (ref 3.87–5.11)
RDW: 14.3 % (ref 11.5–15.5)
WBC: 6.3 10*3/uL (ref 4.0–10.5)

## 2016-01-11 LAB — TROPONIN I

## 2016-01-11 LAB — D-DIMER, QUANTITATIVE (NOT AT ARMC): D DIMER QUANT: 0.31 ug{FEU}/mL (ref 0.00–0.50)

## 2016-01-11 LAB — TSH: TSH: 2.155 u[IU]/mL (ref 0.350–4.500)

## 2016-01-11 MED ORDER — HYDROCHLOROTHIAZIDE 25 MG PO TABS: 25.0000 mg | ORAL_TABLET | Freq: Every day | ORAL | Status: DC

## 2016-01-11 NOTE — ED Notes (Signed)
Pt departed in NAD, refused use of wheelchair.  

## 2016-01-11 NOTE — ED Provider Notes (Signed)
CSN: [id number]     Arrival date & time 01/10/16  2119 History   First MD Initiated Contact with Patient 01/10/16 2318     Chief Complaint  Patient presents with  . Chest Pain     (Consider location/radiation/quality/duration/timing/severity/associated sxs/prior Treatment) HPI Comments: Patient with history of hypertension during pregnancy, last on meds 4 years ago, presents with right sided chest pain intermittent since yesterday. No alleviating or aggravating factors. No pain currently. She denies SOB, nausea, vomiting. No cough or fever.   Patient is a 35 y.o. female presenting with chest pain. The history is provided by the patient. No language interpreter was used.  Chest Pain Pain location:  R chest Pain radiates to:  Does not radiate Pain severity:  Mild Duration:  2 days Timing:  Intermittent Chronicity:  New Associated symptoms: no abdominal pain, no fever, no nausea, no palpitations, no shortness of breath and not vomiting     Past Medical History  Diagnosis Date  . Hypertension    Past Surgical History  Procedure Laterality Date  . No past surgeries    . Cesarean section  06/05/2012    Procedure: CESAREAN SECTION;  Surgeon: Levi AlandMark E Anderson, MD;  Location: [facility];  Service: Obstetrics;  Laterality: N/A;   Family History  Problem Relation Age of Onset  . Hypertension Maternal Grandmother   . Hypertension Maternal Grandfather   . Hypertension Paternal Grandmother   . Hypertension Paternal Grandfather    Social History  Substance Use Topics  . Smoking status: Never Smoker   . Smokeless tobacco: Never Used  . Alcohol Use: No   OB History    Gravida Para Term Preterm AB TAB SAB Ectopic Multiple Living   1 1 1  0 0 0 0 0 0 1     Review of Systems  Constitutional: Negative for fever and chills.  Respiratory: Negative.  Negative for shortness of breath.   Cardiovascular: Positive for chest pain. Negative for palpitations.  Gastrointestinal: Negative.  Negative  for nausea, vomiting and abdominal pain.  Musculoskeletal: Negative.   Neurological: Negative.       Allergies  Review of patient's allergies indicates no known allergies.  Home Medications   Prior to Admission medications   Medication Sig Start Date End Date Taking? Authorizing Provider  cetirizine (ZYRTEC) 10 MG tablet Take 10 mg by mouth daily as needed for allergies.   Yes Historical Provider, MD  diphenhydrAMINE (BENADRYL) 50 MG capsule Take 50 mg by mouth every 6 (six) hours as needed for allergies.   Yes Historical Provider, MD  ibuprofen (ADVIL,MOTRIN) 600 MG tablet Take 1 tablet (600 mg total) by mouth every 6 (six) hours as needed for pain. 06/07/12  Yes Waynard ReedsKendra Ross, MD   BP 188/99 mmHg  Pulse 95  Temp(Src) 98.5 F (36.9 C) (Oral)  Resp 21  Ht 5\' 3"  (1.6 m)  Wt 89.132 kg  BMI 34.82 kg/m2  SpO2 100%  LMP 01/08/2016 Physical Exam  Constitutional: She is oriented to person, place, and time. She appears well-developed and well-nourished.  HENT:  Head: Normocephalic.  Neck: Normal range of motion. Neck supple.  Cardiovascular: Normal rate and regular rhythm.   No murmur heard. Pulmonary/Chest: Effort normal and breath sounds normal. She has no wheezes. She has no rales. She exhibits no tenderness.  Abdominal: Soft. Bowel sounds are normal. There is no tenderness. There is no rebound and no guarding.  Musculoskeletal: Normal range of motion.  Neurological: She is alert and oriented to person,  place, and time.  Skin: Skin is warm and dry. No rash noted.  Psychiatric: She has a normal mood and affect.    ED Course  Procedures (including critical care time) Labs Review Labs Reviewed  CBC WITH DIFFERENTIAL/PLATELET  BASIC METABOLIC PANEL  TROPONIN I   Results for orders placed or performed during the hospital encounter of 01/10/16  CBC with Differential  Result Value Ref Range   WBC 6.3 4.0 - 10.5 K/uL   RBC 4.57 3.87 - 5.11 MIL/uL   Hemoglobin 12.2 12.0 - 15.0  g/dL   HCT 95.236.6 84.136.0 - 32.446.0 %   MCV 80.1 78.0 - 100.0 fL   MCH 26.7 26.0 - 34.0 pg   MCHC 33.3 30.0 - 36.0 g/dL   RDW 40.114.3 02.711.5 - 25.315.5 %   Platelets 274 150 - 400 K/uL   Neutrophils Relative % 51 %   Neutro Abs 3.3 1.7 - 7.7 K/uL   Lymphocytes Relative 39 %   Lymphs Abs 2.5 0.7 - 4.0 K/uL   Monocytes Relative 8 %   Monocytes Absolute 0.5 0.1 - 1.0 K/uL   Eosinophils Relative 1 %   Eosinophils Absolute 0.0 0.0 - 0.7 K/uL   Basophils Relative 1 %   Basophils Absolute 0.0 0.0 - 0.1 K/uL  Basic metabolic panel  Result Value Ref Range   Sodium 137 135 - 145 mmol/L   Potassium 2.9 (L) 3.5 - 5.1 mmol/L   Chloride 103 101 - 111 mmol/L   CO2 25 22 - 32 mmol/L   Glucose, Bld 103 (H) 65 - 99 mg/dL   BUN 9 6 - 20 mg/dL   Creatinine, Ser 6.640.74 0.44 - 1.00 mg/dL   Calcium 9.4 8.9 - 40.310.3 mg/dL   GFR calc non Af Amer >60 >60 mL/min   GFR calc Af Amer >60 >60 mL/min   Anion gap 9 5 - 15  Troponin I  Result Value Ref Range   Troponin I <0.03 <0.031 ng/mL  D-dimer, quantitative (not at [facility])  Result Value Ref Range   D-Dimer, Quant 0.31 0.00 - 0.50 ug/mL-FEU  TSH  Result Value Ref Range   TSH 2.155 0.350 - 4.500 uIU/mL   Dg Chest 2 View  01/11/2016  CLINICAL DATA:  Right-sided chest pressure, onset tonight. EXAM: CHEST  2 VIEW COMPARISON:  None. FINDINGS: The lungs are clear. The pulmonary vasculature is normal. Heart size is normal. Hilar and mediastinal contours are unremarkable. There is no pleural effusion. IMPRESSION: No active cardiopulmonary disease. Electronically Signed   By: Ellery Plunkaniel R Mitchell M.D.   On: 01/11/2016 00:32     Imaging Review No results found. I have personally reviewed and evaluated these images and lab results as part of my medical decision-making.   EKG Interpretation None      MDM   Final diagnoses:  None    1. Essential hypertension 2. Tachycardia  Patient presents with right sided chest pain, no pain in ED. Labs, imaging, EKG reassuring. No  cardiac history. Doubt ACS.  Patient reports history of HTN with pregnancy (4 years ago), no medications since that time. Found to be significantly hypertensive in ED. Medications provided. Tachycardia to 110 in setting of chest pain. Normal D-dimer, no pleuritic pain, no pain currently. Doubt PE. Thyroid studies pending for outpatient follow up.   She is felt stable for discharge home. Referral for PCP provided.     Elpidio AnisShari Nekisha Mcdiarmid, PA-C 01/16/16 0129  Azalia BilisKevin Campos, MD 01/20/16 [phone]

## 2016-01-11 NOTE — Discharge Instructions (Signed)
Hypertension Hypertension, commonly called high blood pressure, is when the force of blood pumping through your arteries is too strong. Your arteries are the blood vessels that carry blood from your heart throughout your body. A blood pressure reading consists of a higher number over a lower number, such as 110/72. The higher number (systolic) is the pressure inside your arteries when your heart pumps. The lower number (diastolic) is the pressure inside your arteries when your heart relaxes. Ideally you want your blood pressure below 120/80. Hypertension forces your heart to work harder to pump blood. Your arteries may become narrow or stiff. Having untreated or uncontrolled hypertension can cause heart attack, stroke, kidney disease, and other problems. RISK FACTORS Some risk factors for high blood pressure are controllable. Others are not.  Risk factors you cannot control include:   Race. You may be at higher risk if you are African American.  Age. Risk increases with age.  Gender. Men are at higher risk than women before age 45 years. After age 65, women are at higher risk than men. Risk factors you can control include:  Not getting enough exercise or physical activity.  Being overweight.  Getting too much fat, sugar, calories, or salt in your diet.  Drinking too much alcohol. SIGNS AND SYMPTOMS Hypertension does not usually cause signs or symptoms. Extremely high blood pressure (hypertensive crisis) may cause headache, anxiety, shortness of breath, and nosebleed. DIAGNOSIS To check if you have hypertension, your health care provider will measure your blood pressure while you are seated, with your arm held at the level of your heart. It should be measured at least twice using the same arm. Certain conditions can cause a difference in blood pressure between your right and left arms. A blood pressure reading that is higher than normal on one occasion does not mean that you need treatment. If  it is not clear whether you have high blood pressure, you may be asked to return on a different day to have your blood pressure checked again. Or, you may be asked to monitor your blood pressure at home for 1 or more weeks. TREATMENT Treating high blood pressure includes making lifestyle changes and possibly taking medicine. Living a healthy lifestyle can help lower high blood pressure. You may need to change some of your habits. Lifestyle changes may include:  Following the DASH diet. This diet is high in fruits, vegetables, and whole grains. It is low in salt, red meat, and added sugars.  Keep your sodium intake below 2,300 mg per day.  Getting at least 30-45 minutes of aerobic exercise at least 4 times per week.  Losing weight if necessary.  Not smoking.  Limiting alcoholic beverages.  Learning ways to reduce stress. Your health care provider may prescribe medicine if lifestyle changes are not enough to get your blood pressure under control, and if one of the following is true:  You are 18-59 years of age and your systolic blood pressure is above 140.  You are 60 years of age or older, and your systolic blood pressure is above 150.  Your diastolic blood pressure is above 90.  You have diabetes, and your systolic blood pressure is over 140 or your diastolic blood pressure is over 90.  You have kidney disease and your blood pressure is above 140/90.  You have heart disease and your blood pressure is above 140/90. Your personal target blood pressure may vary depending on your medical conditions, your age, and other factors. HOME CARE INSTRUCTIONS    Have your blood pressure rechecked as directed by your health care provider.   Take medicines only as directed by your health care provider. Follow the directions carefully. Blood pressure medicines must be taken as prescribed. The medicine does not work as well when you skip doses. Skipping doses also puts you at risk for  problems.  Do not smoke.   Monitor your blood pressure at home as directed by your health care provider. SEEK MEDICAL CARE IF:   You think you are having a reaction to medicines taken.  You have recurrent headaches or feel dizzy.  You have swelling in your ankles.  You have trouble with your vision. SEEK IMMEDIATE MEDICAL CARE IF:  You develop a severe headache or confusion.  You have unusual weakness, numbness, or feel faint.  You have severe chest or abdominal pain.  You vomit repeatedly.  You have trouble breathing. MAKE SURE YOU:   Understand these instructions.  Will watch your condition.  Will get help right away if you are not doing well or get worse.   This information is not intended to replace advice given to you by your health care provider. Make sure you discuss any questions you have with your health care provider.   Document Released: 07/04/2005 Document Revised: 11/18/2014 Document Reviewed: 04/26/2013 Elsevier Interactive Patient Education 2016 Elsevier Inc.  

## 2016-01-21 ENCOUNTER — Ambulatory Visit: Payer: BLUE CROSS/BLUE SHIELD | Attending: Internal Medicine | Admitting: Physician Assistant

## 2016-01-21 ENCOUNTER — Encounter: Payer: Self-pay | Admitting: Physician Assistant

## 2016-01-21 VITALS — BP 177/101 | HR 130 | Temp 98.4°F | Resp 16 | Wt 195.4 lb

## 2016-01-21 DIAGNOSIS — E876 Hypokalemia: Secondary | ICD-10-CM

## 2016-01-21 DIAGNOSIS — Z79899 Other long term (current) drug therapy: Secondary | ICD-10-CM | POA: Insufficient documentation

## 2016-01-21 DIAGNOSIS — Z8249 Family history of ischemic heart disease and other diseases of the circulatory system: Secondary | ICD-10-CM | POA: Diagnosis not present

## 2016-01-21 DIAGNOSIS — R0789 Other chest pain: Secondary | ICD-10-CM | POA: Insufficient documentation

## 2016-01-21 DIAGNOSIS — I10 Essential (primary) hypertension: Secondary | ICD-10-CM

## 2016-01-21 LAB — BASIC METABOLIC PANEL
BUN: 7 mg/dL (ref 7–25)
CALCIUM: 9.6 mg/dL (ref 8.6–10.2)
CO2: 25 mmol/L (ref 20–31)
Chloride: 99 mmol/L (ref 98–110)
Creat: 0.68 mg/dL (ref 0.50–1.10)
GLUCOSE: 91 mg/dL (ref 65–99)
POTASSIUM: 3.5 mmol/L (ref 3.5–5.3)
SODIUM: 137 mmol/L (ref 135–146)

## 2016-01-21 MED ORDER — LISINOPRIL-HYDROCHLOROTHIAZIDE 20-12.5 MG PO TABS: 1.0000 | ORAL_TABLET | Freq: Every day | ORAL | Status: DC

## 2016-01-21 NOTE — Progress Notes (Signed)
Patient ID: [name], female   DOB: April 10, 1981, 35 y.o.   MRN: [id number]   [name], is a 35 y.o. female  [id number]  [id number]  DOB - April 10, 1981  Chief Complaint  Patient presents with  . Follow-up    ED for hypertension        Subjective:  Chief Complaint and HPI: [name] is a 35 y.o. female here today to establish care and for a follow up visit after being seen in the ED on 01/10/2016 for R sided chest pain.  She was found to be hypertensive with sinus tachycardia.  EKG otherwise unremarkable.  Negative enzymes.  She is aware of having had htn with pregnancy but none known since. Thyroid was normal at ED.  She does admit to feeling very anxious today and also when she was in the ED.  She was started on HCTZ.  Her potassium was low in the ED.  She has been trying to eat potassium rich foods.  She denies any complaints today other than feeling anxious about being seen at a new doctor's office.  Htn runs in her family.  She admits to inadequate water intake.  She denies palpitations, CP, SOB, HAs.  ED/EKG/labs reviewed.     ROS:   Constitutional:  No f/c, No night sweats, No unexplained weight loss. EENT:  No vision changes, No blurry vision, No hearing changes. No mouth, throat, or ear problems.  Respiratory: No cough, No SOB Cardiac: No CP, no palpitations GI:  No abd pain, No N/V/D. GU: No Urinary s/sx Musculoskeletal: No joint pain Neuro: No headache, no dizziness, no motor weakness.  Skin: No rash Endocrine:  No polydipsia. No polyuria.  Psych: Denies SI/HI  No problems updated.  ALLERGIES: No Known Allergies  PAST MEDICAL HISTORY: Past Medical History  Diagnosis Date  . Hypertension     MEDICATIONS AT HOME: Prior to Admission medications   Medication Sig Start Date End Date Taking? Authorizing Provider  cetirizine (ZYRTEC) 10 MG tablet Take 10 mg by mouth daily as needed for allergies. Reported on 01/21/2016    Historical Provider, MD    diphenhydrAMINE (BENADRYL) 50 MG capsule Take 50 mg by mouth every 6 (six) hours as needed for allergies. Reported on 01/21/2016    Historical Provider, MD  ibuprofen (ADVIL,MOTRIN) 600 MG tablet Take 1 tablet (600 mg total) by mouth every 6 (six) hours as needed for pain. Patient not taking: Reported on 01/21/2016 06/07/12   Waynard ReedsKendra Ross, MD  lisinopril-hydrochlorothiazide (PRINZIDE,ZESTORETIC) 20-12.5 MG tablet Take 1 tablet by mouth daily. 01/21/16   Anders SimmondsAngela M Hermann Dottavio, PA-C     Objective:  EXAM:   Filed Vitals:   01/21/16 1151  BP: 177/101  Pulse: 130  Temp: 98.4 F (36.9 C)  TempSrc: Oral  Resp: 16  Weight: 195 lb 6.4 oz (88.633 kg)  SpO2: 100%    General appearance : A&OX3. NAD. Non-toxic-appearing HEENT: Atraumatic and Normocephalic.   Neck: supple, no JVD. No cervical lymphadenopathy. No thyromegaly Chest/Lungs:  Breathing-non-labored, Good air entry bilaterally, breath sounds normal without rales, rhonchi, or wheezing  CVS: S1 S2 regular, no murmurs, gallops, rubs.  Rate at 98 on exam. Extremities: Bilateral Lower Ext shows no edema, both legs are warm to touch with = pulse throughout Neurology:  CN II-XII grossly intact, Non focal.   Psych:  TP linear. J/I WNL. Normal speech. Appropriate eye contact and affect.  Skin:  No Rash  Data Review No results found for: HGBA1C   Assessment &  Plan   1. HTN (hypertension), benign Uncontrolled on HCTZ 25mg . Stop HCTZ 25mg .  Start Lisinopril/HCT 20/12.5-1 daily - Basic metabolic panel We have discussed target BP range and blood pressure goal. I have advised patient to check BP regularly and to call us back or report to clinic if the numbers are consistently higher than 140/90. We discussed the importance of compliance with medical therapy and DASH diet recommended, consequences of uncontrolled hypertension discussed.  She will check her BP and pulse daily at work and record.  2. Hypokalemia Reduce HCTZ dose in meds - Basic  metabolic panel Continue with adequate water intake and potassium rich foods  Patient have been counseled extensively about nutrition and exercise  Return in about 3 weeks (around 02/11/2016) for to establish care/ f/up for htn and hypokalemia.  The patient was given clear instructions to go to ER or return to medical center if symptoms don't improve, worsen or new problems develop. The patient verbalized understanding. The patient was told to call to get lab results if they haven't heard anything in the next week.     Georgian CoAngela Jamal Haskin, PA-C [facility] [place], [place] [phone]   01/21/2016, 7:15 PM

## 2016-01-21 NOTE — Patient Instructions (Addendum)
Check BP and pulse 2-3 times per week and record.  Bring in to next visit.   Hypertension Hypertension, commonly called high blood pressure, is when the force of blood pumping through your arteries is too strong. Your arteries are the blood vessels that carry blood from your heart throughout your body. A blood pressure reading consists of a higher number over a lower number, such as 110/72. The higher number (systolic) is the pressure inside your arteries when your heart pumps. The lower number (diastolic) is the pressure inside your arteries when your heart relaxes. Ideally you want your blood pressure below 120/80. Hypertension forces your heart to work harder to pump blood. Your arteries may become narrow or stiff. Having untreated or uncontrolled hypertension can cause heart attack, stroke, kidney disease, and other problems. RISK FACTORS Some risk factors for high blood pressure are controllable. Others are not.  Risk factors you cannot control include:   Race. You may be at higher risk if you are African American.  Age. Risk increases with age.  Gender. Men are at higher risk than women before age 36 years. After age 36, women are at higher risk than men. Risk factors you can control include:  Not getting enough exercise or physical activity.  Being overweight.  Getting too much fat, sugar, calories, or salt in your diet.  Drinking too much alcohol. SIGNS AND SYMPTOMS Hypertension does not usually cause signs or symptoms. Extremely high blood pressure (hypertensive crisis) may cause headache, anxiety, shortness of breath, and nosebleed. DIAGNOSIS To check if you have hypertension, your health care provider will measure your blood pressure while you are seated, with your arm held at the level of your heart. It should be measured at least twice using the same arm. Certain conditions can cause a difference in blood pressure between your right and left arms. A blood pressure reading that  is higher than normal on one occasion does not mean that you need treatment. If it is not clear whether you have high blood pressure, you may be asked to return on a different day to have your blood pressure checked again. Or, you may be asked to monitor your blood pressure at home for 1 or more weeks. TREATMENT Treating high blood pressure includes making lifestyle changes and possibly taking medicine. Living a healthy lifestyle can help lower high blood pressure. You may need to change some of your habits. Lifestyle changes may include:  Following the DASH diet. This diet is high in fruits, vegetables, and whole grains. It is low in salt, red meat, and added sugars.  Keep your sodium intake below 2,300 mg per day.  Getting at least 30-45 minutes of aerobic exercise at least 4 times per week.  Losing weight if necessary.  Not smoking.  Limiting alcoholic beverages.  Learning ways to reduce stress. Your health care provider may prescribe medicine if lifestyle changes are not enough to get your blood pressure under control, and if one of the following is true:  You are 3918-36 years of age and your systolic blood pressure is above 140.  You are 36 years of age or older, and your systolic blood pressure is above 150.  Your diastolic blood pressure is above 90.  You have diabetes, and your systolic blood pressure is over 140 or your diastolic blood pressure is over 90.  You have kidney disease and your blood pressure is above 140/90.  You have heart disease and your blood pressure is above 140/90. Your personal  target blood pressure may vary depending on your medical conditions, your age, and other factors. HOME CARE INSTRUCTIONS  Have your blood pressure rechecked as directed by your health care provider.   Take medicines only as directed by your health care provider. Follow the directions carefully. Blood pressure medicines must be taken as prescribed. The medicine does not work as  well when you skip doses. Skipping doses also puts you at risk for problems.  Do not smoke.   Monitor your blood pressure at home as directed by your health care provider. SEEK MEDICAL CARE IF:   You think you are having a reaction to medicines taken.  You have recurrent headaches or feel dizzy.  You have swelling in your ankles.  You have trouble with your vision. SEEK IMMEDIATE MEDICAL CARE IF:  You develop a severe headache or confusion.  You have unusual weakness, numbness, or feel faint.  You have severe chest or abdominal pain.  You vomit repeatedly.  You have trouble breathing. MAKE SURE YOU:   Understand these instructions.  Will watch your condition.  Will get help right away if you are not doing well or get worse.   This information is not intended to replace advice given to you by your health care provider. Make sure you discuss any questions you have with your health care provider.   Document Released: 07/04/2005 Document Revised: 11/18/2014 Document Reviewed: 04/26/2013 Elsevier Interactive Patient Education [employer]2016 Elsevier Inc.

## 2016-01-25 ENCOUNTER — Telehealth: Payer: Self-pay | Admitting: General Practice

## 2016-01-25 NOTE — Telephone Encounter (Signed)
Pt. Called requesting Lab results. Please f/u

## 2016-02-04 NOTE — Telephone Encounter (Signed)
Clld pt - LMOVMTC re lab results. 

## 2016-02-04 NOTE — Telephone Encounter (Signed)
Pt clld back in - advsd of lab results; monitoring and recording her blood pressure readings making sure she brings the recorded readings to her next appt. Pt stated she understood and would.

## 2016-02-04 NOTE — Telephone Encounter (Signed)
-----   Message from Anders SimmondsAngela M McClung, [place]-C sent at 01/22/2016  9:30 AM EDT ----- Please call patient and let her know her potassium is back to normal.  Check Blood pressures as we discussed and bring them in to your next visit.   Thank you! Georgian CoAngela McClung, PA-C

## 2016-03-09 ENCOUNTER — Ambulatory Visit: Payer: BLUE CROSS/BLUE SHIELD | Admitting: Internal Medicine

## 2016-03-24 ENCOUNTER — Encounter: Payer: Self-pay | Admitting: Nurse Practitioner

## 2016-03-24 ENCOUNTER — Ambulatory Visit (INDEPENDENT_AMBULATORY_CARE_PROVIDER_SITE_OTHER): Payer: BLUE CROSS/BLUE SHIELD | Admitting: Nurse Practitioner

## 2016-03-24 VITALS — BP 138/80 | HR 105 | Temp 97.6°F | Wt 189.0 lb

## 2016-03-24 DIAGNOSIS — Z30011 Encounter for initial prescription of contraceptive pills: Secondary | ICD-10-CM

## 2016-03-24 DIAGNOSIS — I10 Essential (primary) hypertension: Secondary | ICD-10-CM | POA: Insufficient documentation

## 2016-03-24 DIAGNOSIS — Z3041 Encounter for surveillance of contraceptive pills: Secondary | ICD-10-CM | POA: Insufficient documentation

## 2016-03-24 LAB — POCT URINE PREGNANCY: Preg Test, Ur: NEGATIVE

## 2016-03-24 MED ORDER — NORGESTIM-ETH ESTRAD TRIPHASIC 0.18/0.215/0.25 MG-25 MCG PO TABS: 1.0000 | ORAL_TABLET | Freq: Every day | ORAL | 11 refills | Status: DC

## 2016-03-24 NOTE — Progress Notes (Addendum)
Subjective:    Patient ID: [name], female    DOB: 04-25-1981, 35 y.o.   MRN: [id number]  Patient presents today to establish care (new patient). We'll also like to discuss blood pressure medication and birth control.  Hypertension  This is a new problem. The current episode started more than 1 month ago. The problem has been gradually improving since onset. The problem is controlled. Pertinent negatives include no anxiety, chest pain, headaches, malaise/fatigue, orthopnea, palpitations, peripheral edema, PND or shortness of breath. There are no associated agents to hypertension. Risk factors for coronary artery disease include family history. Past treatments include ACE inhibitors and diuretics. The current treatment provides significant improvement. Compliance problems include diet and exercise.  There is no history of angina, kidney disease, CAD/MI, heart failure, PVD or a thyroid problem. There is no history of a hypertension causing med or sleep apnea.  Reports home blood pressure readings ranging from 120s/80s to 110s/70s.  Contraceptive: Past use Depo-Provera in the past. Did not like method due to weight gain. Currently using condoms.  Immunizations: declined influenza vaccine Health Maintenance  Topic Date Due  . Pap Smear  09/26/2000  . Flu Shot  03/18/2017*  . Tetanus Vaccine  04/17/2022  . HIV Screening  Completed  *Topic was postponed. The date shown is not the original due date.   Diet:none Weight:  Wt Readings from Last 3 Encounters:  03/24/16 189 lb (85.7 kg)  01/21/16 195 lb 6.4 oz (88.6 kg)  01/10/16 196 lb 8 oz (89.1 kg)   Exercise:none Depression/Suicide: Depression screen [facility] 2/9 01/21/2016  Decreased Interest 0  Down, Depressed, Hopeless 0  PHQ - 2 Score 0  Altered sleeping 0  Tired, decreased energy 0  Change in appetite 0  Feeling bad or failure about yourself  0  Trouble concentrating 0  Moving slowly or fidgety/restless 0  Suicidal thoughts  0  PHQ-9 Score 0    Pap Smear (every 47yr for >21-29 without HPV, every 576yrfor >30-6522yrith HPV):needed, deferred for 64mo77monthom now with physical Advanced Directive: Advanced Directives 01/21/2016  Does patient have an advance directive? No  Would patient like information on creating an advanced directive? -  Pre-existing out of facility DNR order (yellow form or pink MOST form) -   Sexual History (birth control, marital status, STD): Single, sexually active with boyfried, use of condoms, Will like to start oral birth control today. Last menstrual cycle was normal 02/28/16. expecting next menstrual cycle next week.  Medications and allergies reviewed with patient and updated if appropriate.  Patient Active Problem List   Diagnosis Date Noted  . Essential hypertension, benign 03/24/2016  . Encounter for initial prescription of contraceptive pills 03/24/2016    Current Outpatient Prescriptions on File Prior to Visit  Medication Sig Dispense Refill  . cetirizine (ZYRTEC) 10 MG tablet Take 10 mg by mouth daily as needed for allergies. Reported on 01/21/2016    . diphenhydrAMINE (BENADRYL) 50 MG capsule Take 50 mg by mouth every 6 (six) hours as needed for allergies. Reported on 01/21/2016    . ibuprofen (ADVIL,MOTRIN) 600 MG tablet Take 1 tablet (600 mg total) by mouth every 6 (six) hours as needed for pain. 90 tablet 0  . lisinopril-hydrochlorothiazide (PRINZIDE,ZESTORETIC) 20-12.5 MG tablet Take 1 tablet by mouth daily. 30 tablet 3   No current facility-administered medications on file prior to visit.     Past Medical History:  Diagnosis Date  . Hypertension     Past  Surgical History:  Procedure Laterality Date  . CESAREAN SECTION  06/05/2012   Procedure: CESAREAN SECTION;  Surgeon: Olga Millers, MD;  Location: [facility];  Service: Obstetrics;  Laterality: N/A;  . NO PAST SURGERIES      Social History   Social History  . Marital status: Single    Spouse name: N/A  .  Number of children: N/A  . Years of education: N/A   Social History Main Topics  . Smoking status: Never Smoker  . Smokeless tobacco: Never Used  . Alcohol use No  . Drug use: No  . Sexual activity: Yes    Birth control/ protection: Condom     Comment: not since she found out she was pregnant   Other Topics Concern  . None   Social History Narrative  . None    Family History  Problem Relation Age of Onset  . Hypertension Maternal Grandmother   . Hypertension Maternal Grandfather   . Hypertension Paternal Grandmother   . Hypertension Paternal Grandfather   . High blood pressure Mother   . High blood pressure Father   . Anemia Sister   . Crohn's disease Sister   . Diabetes Maternal Aunt   . Crohn's disease Maternal Aunt   . Diabetes Maternal Uncle         Review of Systems  Constitutional: Negative for fever, malaise/fatigue and weight loss.  HENT: Negative for congestion and sore throat.   Eyes:       Negative for visual changes  Respiratory: Negative for cough and shortness of breath.   Cardiovascular: Negative for chest pain, palpitations, orthopnea, leg swelling and PND.  Gastrointestinal: Negative for blood in stool, constipation, diarrhea and heartburn.  Genitourinary: Negative for dysuria, frequency and urgency.  Musculoskeletal: Negative for falls, joint pain and myalgias.  Skin: Negative for rash.  Neurological: Negative for dizziness, sensory change and headaches.  Endo/Heme/Allergies: Does not bruise/bleed easily.  Psychiatric/Behavioral: Negative for depression, substance abuse and suicidal ideas. The patient is not nervous/anxious.     Objective:   Vitals:   03/24/16 0856  BP: 138/80  Pulse: (!) 105  Temp: 97.6 F (36.4 C)    Body mass index is 33.48 kg/m.   Physical Examination:  Physical Exam  Constitutional: She is oriented to person, place, and time and well-developed, well-nourished, and in no distress. No distress.  HENT:  Right  Ear: External ear normal.  Left Ear: External ear normal.  Nose: Nose normal.  Mouth/Throat: No oropharyngeal exudate.  Eyes: Conjunctivae and EOM are normal. Pupils are equal, round, and reactive to light. No scleral icterus.  Neck: Normal range of motion. Neck supple. No thyromegaly present.  Cardiovascular: Normal rate, regular rhythm, normal heart sounds and intact distal pulses.   Pulmonary/Chest: Effort normal and breath sounds normal.  Abdominal: Soft. Bowel sounds are normal. She exhibits no distension. There is no tenderness.  Genitourinary: Rectum normal and vulva normal. Cervix exhibits no motion tenderness.  Musculoskeletal: Normal range of motion. She exhibits no edema or tenderness.  Lymphadenopathy:    She has no cervical adenopathy.  Neurological: She is alert and oriented to person, place, and time. She has normal reflexes. Gait normal.  Skin: Skin is warm and dry.  Psychiatric: Affect and judgment normal.  Vitals reviewed.  Recent Results (from the past 2160 hour(s))  CBC with Differential     Status: None   Collection Time: 01/11/16 12:29 AM  Result Value Ref Range   WBC 6.3 4.0 -  10.5 K/uL   RBC 4.57 3.87 - 5.11 MIL/uL   Hemoglobin 12.2 12.0 - 15.0 g/dL   HCT 36.6 36.0 - 46.0 %   MCV 80.1 78.0 - 100.0 fL   MCH 26.7 26.0 - 34.0 pg   MCHC 33.3 30.0 - 36.0 g/dL   RDW 14.3 11.5 - 15.5 %   Platelets 274 150 - 400 K/uL   Neutrophils Relative % 51 %   Neutro Abs 3.3 1.7 - 7.7 K/uL   Lymphocytes Relative 39 %   Lymphs Abs 2.5 0.7 - 4.0 K/uL   Monocytes Relative 8 %   Monocytes Absolute 0.5 0.1 - 1.0 K/uL   Eosinophils Relative 1 %   Eosinophils Absolute 0.0 0.0 - 0.7 K/uL   Basophils Relative 1 %   Basophils Absolute 0.0 0.0 - 0.1 K/uL  Basic metabolic panel     Status: Abnormal   Collection Time: 01/11/16 12:29 AM  Result Value Ref Range   Sodium 137 135 - 145 mmol/L   Potassium 2.9 (L) 3.5 - 5.1 mmol/L   Chloride 103 101 - 111 mmol/L   CO2 25 22 - 32  mmol/L   Glucose, Bld 103 (H) 65 - 99 mg/dL   BUN 9 6 - 20 mg/dL   Creatinine, Ser 0.74 0.44 - 1.00 mg/dL   Calcium 9.4 8.9 - 10.3 mg/dL   GFR calc non Af Amer >60 >60 mL/min   GFR calc Af Amer >60 >60 mL/min    Comment: (NOTE) The eGFR has been calculated using the CKD EPI equation. This calculation has not been validated in all clinical situations. eGFR's persistently <60 mL/min signify possible Chronic Kidney Disease.    Anion gap 9 5 - 15  Troponin I     Status: None   Collection Time: 01/11/16 12:29 AM  Result Value Ref Range   Troponin I <0.03 <0.031 ng/mL    Comment:        NO INDICATION OF MYOCARDIAL INJURY.   D-dimer, quantitative (not at [facility])     Status: None   Collection Time: 01/11/16  2:39 AM  Result Value Ref Range   D-Dimer, Quant 0.31 0.00 - 0.50 ug/mL-FEU    Comment: (NOTE) At the manufacturer cut-off of 0.50 ug/mL FEU, this assay has been documented to exclude PE with a sensitivity and negative predictive value of 97 to 99%.  At this time, this assay has not been approved by the FDA to exclude DVT/VTE. Results should be correlated with clinical presentation.   TSH     Status: None   Collection Time: 01/11/16  2:39 AM  Result Value Ref Range   TSH 2.155 0.350 - 4.500 uIU/mL  Basic metabolic panel     Status: None   Collection Time: 01/21/16 12:28 PM  Result Value Ref Range   Sodium 137 135 - 146 mmol/L   Potassium 3.5 3.5 - 5.3 mmol/L   Chloride 99 98 - 110 mmol/L   CO2 25 20 - 31 mmol/L   Glucose, Bld 91 65 - 99 mg/dL   BUN 7 7 - 25 mg/dL   Creat 0.68 0.50 - 1.10 mg/dL   Calcium 9.6 8.6 - 10.2 mg/dL  POCT urine pregnancy     Status: Normal   Collection Time: 03/24/16  9:34 AM  Result Value Ref Range   Preg Test, Ur Negative Negative    ASSESSMENT and PLAN:  Diagnoses and all orders for this visit:  Essential hypertension, benign  Encounter for initial prescription of  contraceptive pills -     POCT urine pregnancy -      Norgestimate-Ethinyl Estradiol Triphasic (TRI-LO-SPRINTEC) 0.18/0.215/0.25 MG-25 MCG tab; Take 1 tablet by mouth daily.   Essential hypertension, benign Controlled. Continue lisinopril HCTZ. Follow-up in 3 months.  Encounter for initial prescription of contraceptive pills Urine pregnancy done today.  Start by contraceptive on Sunday after menstrual cycle starts.  Patient advised that contraceptive is only effective after 1 month of use. She is to use condoms religiously for the first month.     Follow up: Return in about 3 months (around 06/23/2016) for HTN and CPE with PAP.  Wilfred Lacy, NP

## 2016-03-24 NOTE — Patient Instructions (Signed)

## 2016-03-24 NOTE — Assessment & Plan Note (Signed)
Controlled. Continue lisinopril HCTZ. Follow-up in 3 months.

## 2016-03-24 NOTE — Progress Notes (Signed)
Pre visit review using our clinic review tool, if applicable. No additional management support is needed unless otherwise documented below in the visit note. 

## 2016-03-24 NOTE — Assessment & Plan Note (Signed)
Urine pregnancy done today.  Start by contraceptive on Sunday after menstrual cycle starts.  Patient advised that contraceptive is only effective after 1 month of use. She is to use condoms religiously for the first month.

## 2016-03-24 NOTE — Progress Notes (Signed)
Reviewed with patient in office. See office note

## 2016-05-12 ENCOUNTER — Other Ambulatory Visit: Payer: Self-pay | Admitting: *Deleted

## 2016-05-12 MED ORDER — LISINOPRIL-HYDROCHLOROTHIAZIDE 20-12.5 MG PO TABS: 1.0000 | ORAL_TABLET | Freq: Every day | ORAL | 1 refills | Status: DC

## 2016-05-12 NOTE — Telephone Encounter (Signed)
Rec'd call pt is needing refill on her Lisinopril. Verified pharmacy inform will send to [facility].../LMB

## 2016-06-23 ENCOUNTER — Encounter: Payer: BLUE CROSS/BLUE SHIELD | Admitting: Nurse Practitioner

## 2016-06-30 ENCOUNTER — Other Ambulatory Visit (HOSPITAL_COMMUNITY)
Admission: RE | Admit: 2016-06-30 | Discharge: 2016-06-30 | Disposition: A | Discharge status: Home / Self Care | Payer: BLUE CROSS/BLUE SHIELD | Source: Ambulatory Visit | Attending: Nurse Practitioner | Admitting: Nurse Practitioner

## 2016-06-30 ENCOUNTER — Encounter: Payer: Self-pay | Admitting: Nurse Practitioner

## 2016-06-30 ENCOUNTER — Other Ambulatory Visit (INDEPENDENT_AMBULATORY_CARE_PROVIDER_SITE_OTHER): Payer: BLUE CROSS/BLUE SHIELD

## 2016-06-30 ENCOUNTER — Ambulatory Visit (INDEPENDENT_AMBULATORY_CARE_PROVIDER_SITE_OTHER): Payer: BLUE CROSS/BLUE SHIELD | Admitting: Nurse Practitioner

## 2016-06-30 ENCOUNTER — Other Ambulatory Visit: Payer: BLUE CROSS/BLUE SHIELD

## 2016-06-30 ENCOUNTER — Telehealth: Payer: Self-pay | Admitting: Nurse Practitioner

## 2016-06-30 VITALS — BP 152/86 | HR 107 | Temp 98.4°F | Ht 63.0 in | Wt 187.0 lb

## 2016-06-30 DIAGNOSIS — Z124 Encounter for screening for malignant neoplasm of cervix: Secondary | ICD-10-CM | POA: Diagnosis not present

## 2016-06-30 DIAGNOSIS — I10 Essential (primary) hypertension: Secondary | ICD-10-CM

## 2016-06-30 DIAGNOSIS — N760 Acute vaginitis: Secondary | ICD-10-CM

## 2016-06-30 DIAGNOSIS — Z0001 Encounter for general adult medical examination with abnormal findings: Secondary | ICD-10-CM | POA: Diagnosis not present

## 2016-06-30 DIAGNOSIS — Z01411 Encounter for gynecological examination (general) (routine) with abnormal findings: Secondary | ICD-10-CM | POA: Insufficient documentation

## 2016-06-30 DIAGNOSIS — B9689 Other specified bacterial agents as the cause of diseases classified elsewhere: Secondary | ICD-10-CM | POA: Diagnosis not present

## 2016-06-30 DIAGNOSIS — Z1151 Encounter for screening for human papillomavirus (HPV): Secondary | ICD-10-CM | POA: Diagnosis not present

## 2016-06-30 DIAGNOSIS — N898 Other specified noninflammatory disorders of vagina: Secondary | ICD-10-CM

## 2016-06-30 LAB — LIPID PANEL
CHOL/HDL RATIO: 2
Cholesterol: 169 mg/dL (ref 0–200)
HDL: 68.7 mg/dL (ref >39.00)
LDL CALC: 86 mg/dL (ref 0–99)
NonHDL: 100.02
TRIGLYCERIDES: 70 mg/dL (ref 0.0–149.0)
VLDL: 14 mg/dL (ref 0.0–40.0)

## 2016-06-30 LAB — HEPATIC FUNCTION PANEL
ALK PHOS: 45 U/L (ref 39–117)
ALT: 24 U/L (ref 0–35)
AST: 21 U/L (ref 0–37)
Albumin: 4.1 g/dL (ref 3.5–5.2)
BILIRUBIN DIRECT: 0.1 mg/dL (ref 0.0–0.3)
TOTAL PROTEIN: 7.7 g/dL (ref 6.0–8.3)
Total Bilirubin: 0.4 mg/dL (ref 0.2–1.2)

## 2016-06-30 LAB — TSH: TSH: 1.45 u[IU]/mL (ref 0.35–4.50)

## 2016-06-30 LAB — WET PREP, GENITAL
Trich, Wet Prep: NONE SEEN — AB
Yeast Wet Prep HPF POC: NONE SEEN — AB

## 2016-06-30 MED ORDER — METRONIDAZOLE 500 MG PO TABS: 500.0000 mg | ORAL_TABLET | Freq: Once | ORAL | 0 refills | Status: AC

## 2016-06-30 NOTE — Progress Notes (Signed)
Subjective:    Patient ID: [name], female    DOB: 01/29/1981, 35 y.o.   MRN: [id number]  Patient presents today for complete physical or establish care (new patient)  HPI  Immunizations: (TDAP, Hep C screen, Pneumovax, Influenza, zoster)  Health Maintenance  Topic Date Due  . Pap Smear  09/26/2000  . Flu Shot  11/14/2016*  . Tetanus Vaccine  04/17/2022  . HIV Screening  Completed  *Topic was postponed. The date shown is not the original due date.   Diet:regular Weight:  Wt Readings from Last 3 Encounters:  06/30/16 187 lb (84.8 kg)  03/24/16 189 lb (85.7 kg)  01/21/16 195 lb 6.4 oz (88.6 kg)   Exercise:none Home Safety:home with daughter (4) [facility]  Decreased Interest 0  Down, Depressed, Hopeless 0  PHQ - 2 Score 0  Altered sleeping 0  Tired, decreased energy 0  Change in appetite 0  Feeling bad or failure about yourself  0  Trouble concentrating 0  Moving slowly or fidgety/restless 0  Suicidal thoughts 0  PHQ-9 Score 0   No flowsheet data found.  Pap Smear (every 6649yrs for >21-29 without HPV, every 4940yrs for >30-55640yrs with HPV):needed Vision:needed, hx of stigmatism Dental:needed Advanced Directive: Advanced Directives 01/21/2016  Does Patient Have a Medical Advance Directive? No  Would patient like information on creating a medical advance directive? -  Pre-existing out of facility DNR order (yellow form or pink MOST form) -   Sexual History (birth control, marital status, STD): sexually active  Medications and allergies reviewed with patient and updated if appropriate.  Patient Active Problem List   Diagnosis Date Noted  . Essential hypertension, benign 03/24/2016  . Encounter for initial prescription of contraceptive pills 03/24/2016    Current Outpatient Prescriptions on File Prior to Visit  Medication Sig Dispense Refill  . cetirizine (ZYRTEC) 10 MG tablet Take 10 mg by mouth daily as needed  for allergies. Reported on 01/21/2016    . diphenhydrAMINE (BENADRYL) 50 MG capsule Take 50 mg by mouth every 6 (six) hours as needed for allergies. Reported on 01/21/2016    . ibuprofen (ADVIL,MOTRIN) 600 MG tablet Take 1 tablet (600 mg total) by mouth every 6 (six) hours as needed for pain. 90 tablet 0  . lisinopril-hydrochlorothiazide (PRINZIDE,ZESTORETIC) 20-12.5 MG tablet Take 1 tablet by mouth daily. 90 tablet 1  . Norgestimate-Ethinyl Estradiol Triphasic (TRI-LO-SPRINTEC) 0.18/0.215/0.25 MG-25 MCG tab Take 1 tablet by mouth daily. 1 Package 11   No current facility-administered medications on file prior to visit.     Past Medical History:  Diagnosis Date  . Hypertension     Past Surgical History:  Procedure Laterality Date  . CESAREAN SECTION  06/05/2012   Procedure: CESAREAN SECTION;  Surgeon: Levi AlandMark E Anderson, MD;  Location: [facility];  Service: Obstetrics;  Laterality: N/A;  . NO PAST SURGERIES      Social History   Social History  . Marital status: Single    Spouse name: N/A  . Number of children: N/A  . Years of education: N/A   Social History Main Topics  . Smoking status: Never Smoker  . Smokeless tobacco: Never Used  . Alcohol use No  . Drug use: No  . Sexual activity: Yes    Birth control/ protection: Condom     Comment: not since she found out she was pregnant   Other Topics Concern  . None   Social History Narrative  . None  Family History  Problem Relation Age of Onset  . Hypertension Maternal Grandmother   . Hypertension Maternal Grandfather   . Hypertension Paternal Grandmother   . Hypertension Paternal Grandfather   . High blood pressure Mother   . High blood pressure Father   . Anemia Sister   . Crohn's disease Sister   . Diabetes Maternal Aunt   . Crohn's disease Maternal Aunt   . Diabetes Maternal Uncle         Review of Systems  Constitutional: Negative for fever, malaise/fatigue and weight loss.  HENT: Negative for congestion and  sore throat.   Eyes:       Negative for visual changes  Respiratory: Negative for cough and shortness of breath.   Cardiovascular: Negative for chest pain, palpitations and leg swelling.  Gastrointestinal: Negative for blood in stool, constipation, diarrhea and heartburn.  Genitourinary: Negative for dysuria, frequency and urgency.  Musculoskeletal: Negative for falls, joint pain and myalgias.  Skin: Negative for rash.  Neurological: Negative for dizziness, sensory change and headaches.  Endo/Heme/Allergies: Does not bruise/bleed easily.  Psychiatric/Behavioral: Negative for depression, substance abuse and suicidal ideas. The patient is not nervous/anxious.     Objective:   Vitals:   06/30/16 0946  BP: (!) 152/86  Pulse: (!) 107  Temp: 98.4 F (36.9 C)    Body mass index is 33.13 kg/m.  BP Readings from Last 3 Encounters:  06/30/16 (!) 152/86  03/24/16 138/80  01/21/16 (!) 177/101    Physical Examination:  Physical Exam  Constitutional: She is oriented to person, place, and time and well-developed, well-nourished, and in no distress. No distress.  HENT:  Right Ear: External ear normal.  Left Ear: External ear normal.  Nose: Nose normal.  Mouth/Throat: No oropharyngeal exudate.  Eyes: Conjunctivae and EOM are normal. Pupils are equal, round, and reactive to light. No scleral icterus.  Neck: Normal range of motion. Neck supple. No thyromegaly present.  Cardiovascular: Normal rate, regular rhythm, normal heart sounds and intact distal pulses.   Pulmonary/Chest: Effort normal and breath sounds normal. Right breast exhibits no inverted nipple, no mass, no nipple discharge, no skin change and no tenderness. Left breast exhibits no inverted nipple, no mass, no nipple discharge, no skin change and no tenderness. Breasts are symmetrical.  Abdominal: Soft. Bowel sounds are normal. She exhibits no distension. There is no tenderness.  Genitourinary: Rectum normal, cervix normal,  right adnexa normal, left adnexa normal and vulva normal. Rectal exam shows no external hemorrhoid. Cervix exhibits no motion tenderness. Vagina exhibits no lesion. Creamy  fishy  white  yellow and vaginal discharge found.  Musculoskeletal: Normal range of motion. She exhibits no edema or tenderness.  Lymphadenopathy:    She has no cervical adenopathy.  Neurological: She is alert and oriented to person, place, and time. Gait normal.  Skin: Skin is warm and dry.  Psychiatric: Affect and judgment normal.  Vitals reviewed.   ASSESSMENT and PLAN:  Barbarita was seen today for annual exam.  Diagnoses and all orders for this visit:  Encounter for preventative adult health care exam with abnormal findings -     CBC w/Diff; Future -     Hepatic function panel; Future -     TSH; Future -     Lipid panel; Future -     Cytology - PAP; Future  Essential hypertension, benign  Encounter for Papanicolaou smear for cervical cancer screening -     Cytology - PAP; Future  BV (bacterial vaginosis) -  metroNIDAZOLE (FLAGYL) 500 MG tablet; Take 1 tablet (500 mg total) by mouth once. -     Wet prep, genital; Future   No problem-specific Assessment & Plan notes found for this encounter.     Follow up: Return in about 3 months (around 09/28/2016) for htn.  Alysia Penna, NP

## 2016-06-30 NOTE — Telephone Encounter (Signed)
Patient called back in.  Gave [name] response to wet prep.

## 2016-06-30 NOTE — Progress Notes (Signed)
Pre visit review using our clinic review tool, if applicable. No additional management support is needed unless otherwise documented below in the visit note. 

## 2016-06-30 NOTE — Patient Instructions (Addendum)
Wet prep positive for clue cells and WBC. Treated with metronidazole.  Health Maintenance, Female Introduction Adopting a healthy lifestyle and getting preventive care can go a long way to promote health and wellness. Talk with your health care provider about what schedule of regular examinations is right for you. This is a good chance for you to check in with your provider about disease prevention and staying healthy. In between checkups, there are plenty of things you can do on your own. Experts have done a lot of research about which lifestyle changes and preventive measures are most likely to keep you healthy. Ask your health care provider for more information. Weight and diet Eat a healthy diet  Be sure to include plenty of vegetables, fruits, low-fat dairy products, and lean protein.  Do not eat a lot of foods high in solid fats, added sugars, or salt.  Get regular exercise. This is one of the most important things you can do for your health.  Most adults should exercise for at least 150 minutes each week. The exercise should increase your heart rate and make you sweat (moderate-intensity exercise).  Most adults should also do strengthening exercises at least twice a week. This is in addition to the moderate-intensity exercise. Maintain a healthy weight  Body mass index (BMI) is a measurement that can be used to identify possible weight problems. It estimates body fat based on height and weight. Your health care provider can help determine your BMI and help you achieve or maintain a healthy weight.  For females 78 years of age and older:  A BMI below 18.5 is considered underweight.  A BMI of 18.5 to 24.9 is normal.  A BMI of 25 to 29.9 is considered overweight.  A BMI of 30 and above is considered obese. Watch levels of cholesterol and blood lipids  You should start having your blood tested for lipids and cholesterol at 36 years of age, then have this test every 5 years.  You  may need to have your cholesterol levels checked more often if:  Your lipid or cholesterol levels are high.  You are older than 36 years of age.  You are at high risk for heart disease. Cancer screening Lung Cancer  Lung cancer screening is recommended for adults 23-105 years old who are at high risk for lung cancer because of a history of smoking.  A yearly low-dose CT scan of the lungs is recommended for people who:  Currently smoke.  Have quit within the past 15 years.  Have at least a 30-pack-year history of smoking. A pack year is smoking an average of one pack of cigarettes a day for 1 year.  Yearly screening should continue until it has been 15 years since you quit.  Yearly screening should stop if you develop a health problem that would prevent you from having lung cancer treatment. Breast Cancer  Practice breast self-awareness. This means understanding how your breasts normally appear and feel.  It also means doing regular breast self-exams. Let your health care provider know about any changes, no matter how small.  If you are in your 20s or 30s, you should have a clinical breast exam (CBE) by a health care provider every 1-3 years as part of a regular health exam.  If you are 70 or older, have a CBE every year. Also consider having a breast X-ray (mammogram) every year.  If you have a family history of breast cancer, talk to your health care provider about  genetic screening.  If you are at high risk for breast cancer, talk to your health care provider about having an MRI and a mammogram every year.  Breast cancer gene (BRCA) assessment is recommended for women who have family members with BRCA-related cancers. BRCA-related cancers include:  Breast.  Ovarian.  Tubal.  Peritoneal cancers.  Results of the assessment will determine the need for genetic counseling and BRCA1 and BRCA2 testing. Cervical Cancer  Your health care provider may recommend that you be  screened regularly for cancer of the pelvic organs (ovaries, uterus, and vagina). This screening involves a pelvic examination, including checking for microscopic changes to the surface of your cervix (Pap test). You may be encouraged to have this screening done every 3 years, beginning at age 36.  For women ages 44-65, health care providers may recommend pelvic exams and Pap testing every 3 years, or they may recommend the Pap and pelvic exam, combined with testing for human papilloma virus (HPV), every 5 years. Some types of HPV increase your risk of cervical cancer. Testing for HPV may also be done on women of any age with unclear Pap test results.  Other health care providers may not recommend any screening for nonpregnant women who are considered low risk for pelvic cancer and who do not have symptoms. Ask your health care provider if a screening pelvic exam is right for you.  If you have had past treatment for cervical cancer or a condition that could lead to cancer, you need Pap tests and screening for cancer for at least 20 years after your treatment. If Pap tests have been discontinued, your risk factors (such as having a new sexual partner) need to be reassessed to determine if screening should resume. Some women have medical problems that increase the chance of getting cervical cancer. In these cases, your health care provider may recommend more frequent screening and Pap tests. Colorectal Cancer  This type of cancer can be detected and often prevented.  Routine colorectal cancer screening usually begins at 36 years of age and continues through 36 years of age.  Your health care provider may recommend screening at an earlier age if you have risk factors for colon cancer.  Your health care provider may also recommend using home test kits to check for hidden blood in the stool.  A small camera at the end of a tube can be used to examine your colon directly (sigmoidoscopy or colonoscopy).  This is done to check for the earliest forms of colorectal cancer.  Routine screening usually begins at age 31.  Direct examination of the colon should be repeated every 5-10 years through 37 years of age. However, you may need to be screened more often if early forms of precancerous polyps or small growths are found. Skin Cancer  Check your skin from head to toe regularly.  Tell your health care provider about any new moles or changes in moles, especially if there is a change in a mole's shape or color.  Also tell your health care provider if you have a mole that is larger than the size of a pencil eraser.  Always use sunscreen. Apply sunscreen liberally and repeatedly throughout the day.  Protect yourself by wearing long sleeves, pants, a wide-brimmed hat, and sunglasses whenever you are outside. Heart disease, diabetes, and high blood pressure  High blood pressure causes heart disease and increases the risk of stroke. High blood pressure is more likely to develop in:  People who have blood  pressure in the high end of the normal range (130-139/85-89 mm Hg).  People who are overweight or obese.  People who are African American.  If you are 4-38 years of age, have your blood pressure checked every 3-5 years. If you are 41 years of age or older, have your blood pressure checked every year. You should have your blood pressure measured twice-once when you are at a hospital or clinic, and once when you are not at a hospital or clinic. Record the average of the two measurements. To check your blood pressure when you are not at a hospital or clinic, you can use:  An automated blood pressure machine at a pharmacy.  A home blood pressure monitor.  If you are between 74 years and 40 years old, ask your health care provider if you should take aspirin to prevent strokes.  Have regular diabetes screenings. This involves taking a blood sample to check your fasting blood sugar level.  If you  are at a normal weight and have a low risk for diabetes, have this test once every three years after 36 years of age.  If you are overweight and have a high risk for diabetes, consider being tested at a younger age or more often. Preventing infection Hepatitis B  If you have a higher risk for hepatitis B, you should be screened for this virus. You are considered at high risk for hepatitis B if:  You were born in a country where hepatitis B is common. Ask your health care provider which countries are considered high risk.  Your parents were born in a high-risk country, and you have not been immunized against hepatitis B (hepatitis B vaccine).  You have HIV or AIDS.  You use needles to inject street drugs.  You live with someone who has hepatitis B.  You have had sex with someone who has hepatitis B.  You get hemodialysis treatment.  You take certain medicines for conditions, including cancer, organ transplantation, and autoimmune conditions. Hepatitis C  Blood testing is recommended for:  Everyone born from [birth year] through 1966.  Anyone with known risk factors for hepatitis C. Sexually transmitted infections (STIs)  You should be screened for sexually transmitted infections (STIs) including gonorrhea and chlamydia if:  You are sexually active and are younger than 36 years of age.  You are older than 36 years of age and your health care provider tells you that you are at risk for this type of infection.  Your sexual activity has changed since you were last screened and you are at an increased risk for chlamydia or gonorrhea. Ask your health care provider if you are at risk.  If you do not have HIV, but are at risk, it may be recommended that you take a prescription medicine daily to prevent HIV infection. This is called pre-exposure prophylaxis (PrEP). You are considered at risk if:  You are sexually active and do not regularly use condoms or know the HIV status of your  partner(s).  You take drugs by injection.  You are sexually active with a partner who has HIV. Talk with your health care provider about whether you are at high risk of being infected with HIV. If you choose to begin PrEP, you should first be tested for HIV. You should then be tested every 3 months for as long as you are taking PrEP. Pregnancy  If you are premenopausal and you may become pregnant, ask your health care provider about preconception counseling.  If  you may become pregnant, take 400 to 800 micrograms (mcg) of folic acid every day.  If you want to prevent pregnancy, talk to your health care provider about birth control (contraception). Osteoporosis and menopause  Osteoporosis is a disease in which the bones lose minerals and strength with aging. This can result in serious bone fractures. Your risk for osteoporosis can be identified using a bone density scan.  If you are 82 years of age or older, or if you are at risk for osteoporosis and fractures, ask your health care provider if you should be screened.  Ask your health care provider whether you should take a calcium or vitamin D supplement to lower your risk for osteoporosis.  Menopause may have certain physical symptoms and risks.  Hormone replacement therapy may reduce some of these symptoms and risks. Talk to your health care provider about whether hormone replacement therapy is right for you. Follow these instructions at home:  Schedule regular health, dental, and eye exams.  Stay current with your immunizations.  Do not use any tobacco products including cigarettes, chewing tobacco, or electronic cigarettes.  If you are pregnant, do not drink alcohol.  If you are breastfeeding, limit how much and how often you drink alcohol.  Limit alcohol intake to no more than 1 drink per day for nonpregnant women. One drink equals 12 ounces of beer, 5 ounces of wine, or 1 ounces of hard liquor.  Do not use street  drugs.  Do not share needles.  Ask your health care provider for help if you need support or information about quitting drugs.  Tell your health care provider if you often feel depressed.  Tell your health care provider if you have ever been abused or do not feel safe at home. This information is not intended to replace advice given to you by your health care provider. Make sure you discuss any questions you have with your health care provider. Document Released: 01/17/2011 Document Revised: 12/10/2015 Document Reviewed: 04/07/2015  2017 Elsevier

## 2016-07-01 LAB — CBC WITH DIFFERENTIAL/PLATELET
BASOS ABS: 0 10*3/uL (ref 0.0–0.1)
Basophils Relative: 0.2 % (ref 0.0–3.0)
EOS ABS: 0 10*3/uL (ref 0.0–0.7)
Eosinophils Relative: 0.4 % (ref 0.0–5.0)
HEMATOCRIT: 34.8 % — AB (ref 36.0–46.0)
Hemoglobin: 11.7 g/dL — ABNORMAL LOW (ref 12.0–15.0)
LYMPHS PCT: 28.1 % (ref 12.0–46.0)
Lymphs Abs: 1.6 10*3/uL (ref 0.7–4.0)
MCHC: 33.5 g/dL (ref 30.0–36.0)
MCV: 79.7 fl (ref 78.0–100.0)
Monocytes Absolute: 0.5 10*3/uL (ref 0.1–1.0)
Monocytes Relative: 9.7 % (ref 3.0–12.0)
NEUTROS ABS: 3.5 10*3/uL (ref 1.4–7.7)
Neutrophils Relative %: 61.6 % (ref 43.0–77.0)
PLATELETS: 233 10*3/uL (ref 150.0–400.0)
RBC: 4.37 Mil/uL (ref 3.87–5.11)
RDW: 13.8 % (ref 11.5–15.5)
WBC: 5.7 10*3/uL (ref 4.0–10.5)

## 2016-07-01 NOTE — Progress Notes (Signed)
Normal results

## 2016-07-01 NOTE — Telephone Encounter (Signed)
Pt called in about the meds for the yeast infection

## 2016-07-07 LAB — CYTOLOGY - PAP
DIAGNOSIS: NEGATIVE
HPV: NOT DETECTED

## 2016-07-07 MED ORDER — FLUCONAZOLE 150 MG PO TABS: 150.0000 mg | ORAL_TABLET | Freq: Once | ORAL | 0 refills | Status: AC

## 2016-07-07 NOTE — Addendum Note (Signed)
Addended by: Alysia PennaNCHE, Tanith Dagostino L on: 07/07/2016 11:34 AM   Modules accepted: Orders

## 2016-09-29 ENCOUNTER — Ambulatory Visit: Payer: BLUE CROSS/BLUE SHIELD | Admitting: Nurse Practitioner

## 2016-10-06 ENCOUNTER — Other Ambulatory Visit (INDEPENDENT_AMBULATORY_CARE_PROVIDER_SITE_OTHER): Payer: BLUE CROSS/BLUE SHIELD

## 2016-10-06 ENCOUNTER — Encounter: Payer: Self-pay | Admitting: Nurse Practitioner

## 2016-10-06 ENCOUNTER — Ambulatory Visit (INDEPENDENT_AMBULATORY_CARE_PROVIDER_SITE_OTHER): Payer: BLUE CROSS/BLUE SHIELD | Admitting: Nurse Practitioner

## 2016-10-06 VITALS — BP 164/104 | HR 110 | Temp 97.6°F | Ht 63.0 in | Wt 191.0 lb

## 2016-10-06 DIAGNOSIS — I10 Essential (primary) hypertension: Secondary | ICD-10-CM | POA: Diagnosis not present

## 2016-10-06 LAB — BASIC METABOLIC PANEL
BUN: 9 mg/dL (ref 6–23)
CO2: 29 mEq/L (ref 19–32)
CREATININE: 0.68 mg/dL (ref 0.40–1.20)
Calcium: 9 mg/dL (ref 8.4–10.5)
Chloride: 101 mEq/L (ref 96–112)
GFR: 125.19 mL/min (ref >60.00)
Glucose, Bld: 95 mg/dL (ref 70–99)
POTASSIUM: 3 meq/L — AB (ref 3.5–5.1)
Sodium: 136 mEq/L (ref 135–145)

## 2016-10-06 MED ORDER — LISINOPRIL-HYDROCHLOROTHIAZIDE 20-12.5 MG PO TABS: 1.0000 | ORAL_TABLET | Freq: Every day | ORAL | 1 refills | Status: DC

## 2016-10-06 MED ORDER — POTASSIUM CHLORIDE ER 10 MEQ PO TBCR: 10.0000 meq | EXTENDED_RELEASE_TABLET | Freq: Every day | ORAL | 1 refills | Status: DC

## 2016-10-06 NOTE — Patient Instructions (Signed)
Continue to monitor BP once a day and record. If BP is persistently > 150/90, call office.  With oral contraceptive; take active pills continuously for 2months, then on 3rd month, take placebo pills.  Go to lab for blood draw.   DASH Eating Plan DASH stands for "Dietary Approaches to Stop Hypertension." The DASH eating plan is a healthy eating plan that has been shown to reduce high blood pressure (hypertension). It may also reduce your risk for type 2 diabetes, heart disease, and stroke. The DASH eating plan may also help with weight loss. What are tips for following this plan? General guidelines   Avoid eating more than 2,300 mg (milligrams) of salt (sodium) a day. If you have hypertension, you may need to reduce your sodium intake to 1,500 mg a day.  Limit alcohol intake to no more than 1 drink a day for nonpregnant women and 2 drinks a day for men. One drink equals 12 oz of beer, 5 oz of wine, or 1 oz of hard liquor.  Work with your health care provider to maintain a healthy body weight or to lose weight. Ask what an ideal weight is for you.  Get at least 30 minutes of exercise that causes your heart to beat faster (aerobic exercise) most days of the week. Activities may include walking, swimming, or biking.  Work with your health care provider or diet and nutrition specialist (dietitian) to adjust your eating plan to your individual calorie needs. Reading food labels   Check food labels for the amount of sodium per serving. Choose foods with less than 5 percent of the Daily Value of sodium. Generally, foods with less than 300 mg of sodium per serving fit into this eating plan.  To find whole grains, look for the word "whole" as the first word in the ingredient list. Shopping   Buy products labeled as "low-sodium" or "no salt added."  Buy fresh foods. Avoid canned foods and premade or frozen meals. Cooking   Avoid adding salt when cooking. Use salt-free seasonings or herbs  instead of table salt or sea salt. Check with your health care provider or pharmacist before using salt substitutes.  Do not fry foods. Cook foods using healthy methods such as baking, boiling, grilling, and broiling instead.  Cook with heart-healthy oils, such as olive, canola, soybean, or sunflower oil. Meal planning    Eat a balanced diet that includes:  5 or more servings of fruits and vegetables each day. At each meal, try to fill half of your plate with fruits and vegetables.  Up to 6-8 servings of whole grains each day.  Less than 6 oz of lean meat, poultry, or fish each day. A 3-oz serving of meat is about the same size as a deck of cards. One egg equals 1 oz.  2 servings of low-fat dairy each day.  A serving of nuts, seeds, or beans 5 times each week.  Heart-healthy fats. Healthy fats called Omega-3 fatty acids are found in foods such as flaxseeds and coldwater fish, like sardines, salmon, and mackerel.  Limit how much you eat of the following:  Canned or prepackaged foods.  Food that is high in trans fat, such as fried foods.  Food that is high in saturated fat, such as fatty meat.  Sweets, desserts, sugary drinks, and other foods with added sugar.  Full-fat dairy products.  Do not salt foods before eating.  Try to eat at least 2 vegetarian meals each week.  Eat more  home-cooked food and less restaurant, buffet, and fast food.  When eating at a restaurant, ask that your food be prepared with less salt or no salt, if possible. What foods are recommended? The items listed may not be a complete list. Talk with your dietitian about what dietary choices are best for you. Grains  Whole-grain or whole-wheat bread. Whole-grain or whole-wheat pasta. Brown rice. Orpah Cobbatmeal. Quinoa. Bulgur. Whole-grain and low-sodium cereals. Pita bread. Low-fat, low-sodium crackers. Whole-wheat flour tortillas. Vegetables  Fresh or frozen vegetables (raw, steamed, roasted, or grilled).  Low-sodium or reduced-sodium tomato and vegetable juice. Low-sodium or reduced-sodium tomato sauce and tomato paste. Low-sodium or reduced-sodium canned vegetables. Fruits  All fresh, dried, or frozen fruit. Canned fruit in natural juice (without added sugar). Meat and other protein foods  Skinless chicken or [place]urkey. Ground chicken or [place]urkey. Pork with fat trimmed off. Fish and seafood. Egg whites. Dried beans, peas, or lentils. Unsalted nuts, nut butters, and seeds. Unsalted canned beans. Lean cuts of beef with fat trimmed off. Low-sodium, lean deli meat. Dairy  Low-fat (1%) or fat-free (skim) milk. Fat-free, low-fat, or reduced-fat cheeses. Nonfat, low-sodium ricotta or cottage cheese. Low-fat or nonfat yogurt. Low-fat, low-sodium cheese. Fats and oils  Soft margarine without trans fats. Vegetable oil. Low-fat, reduced-fat, or light mayonnaise and salad dressings (reduced-sodium). Canola, safflower, olive, soybean, and sunflower oils. Avocado. Seasoning and other foods  Herbs. Spices. Seasoning mixes without salt. Unsalted popcorn and pretzels. Fat-free sweets. What foods are not recommended? The items listed may not be a complete list. Talk with your dietitian about what dietary choices are best for you. Grains  Baked goods made with fat, such as croissants, muffins, or some breads. Dry pasta or rice meal packs. Vegetables  Creamed or fried vegetables. Vegetables in a cheese sauce. Regular canned vegetables (not low-sodium or reduced-sodium). Regular canned tomato sauce and paste (not low-sodium or reduced-sodium). Regular tomato and vegetable juice (not low-sodium or reduced-sodium). Rosita FirePickles. Olives. Fruits  Canned fruit in a light or heavy syrup. Fried fruit. Fruit in cream or butter sauce. Meat and other protein foods  Fatty cuts of meat. Ribs. Fried meat. Tomasa BlaseBacon. Sausage. Bologna and other processed lunch meats. Salami. Fatback. Hotdogs. Bratwurst. Salted nuts and seeds. Canned beans with  added salt. Canned or smoked fish. Whole eggs or egg yolks. Chicken or [place]key with skin. Dairy  Whole or 2% milk, cream, and half-and-half. Whole or full-fat cream cheese. Whole-fat or sweetened yogurt. Full-fat cheese. Nondairy creamers. Whipped toppings. Processed cheese and cheese spreads. Fats and oils  Butter. Stick margarine. Lard. Shortening. Ghee. Bacon fat. Tropical oils, such as coconut, palm kernel, or palm oil. Seasoning and other foods  Salted popcorn and pretzels. Onion salt, garlic salt, seasoned salt, table salt, and sea salt. Worcestershire sauce. Tartar sauce. Barbecue sauce. Teriyaki sauce. Soy sauce, including reduced-sodium. Steak sauce. Canned and packaged gravies. Fish sauce. Oyster sauce. Cocktail sauce. Horseradish that you find on the shelf. Ketchup. Mustard. Meat flavorings and tenderizers. Bouillon cubes. Hot sauce and Tabasco sauce. Premade or packaged marinades. Premade or packaged taco seasonings. Relishes. Regular salad dressings. Where to find more information:  National Heart, Lung, and Blood Institute: PopSteam.iswww.nhlbi.nih.gov  American Heart Association: www.heart.org Summary  The DASH eating plan is a healthy eating plan that has been shown to reduce high blood pressure (hypertension). It may also reduce your risk for type 2 diabetes, heart disease, and stroke.  With the DASH eating plan, you should limit salt (sodium) intake to 2,300 mg a day.  If you have hypertension, you may need to reduce your sodium intake to 1,500 mg a day.  When on the DASH eating plan, aim to eat more fresh fruits and vegetables, whole grains, lean proteins, low-fat dairy, and heart-healthy fats.  Work with your health care provider or diet and nutrition specialist (dietitian) to adjust your eating plan to your individual calorie needs. This information is not intended to replace advice given to you by your health care provider. Make sure you discuss any questions you have with your health  care provider. Document Released: 06/23/2011 Document Revised: 06/27/2016 Document Reviewed: 06/27/2016 Elsevier Interactive Patient Education  2017 [employer].

## 2016-10-06 NOTE — Progress Notes (Signed)
Subjective:  Patient ID: [name], female    DOB: 1981-06-26  Age: 36 y.o. MRN: [id number]  CC: Follow-up (3 mo follow up/)   Hypertension  This is a chronic problem. The current episode started more than 1 year ago. The problem has been waxing and waning since onset. The problem is uncontrolled. Pertinent negatives include no anxiety, chest pain, headaches, malaise/fatigue, neck pain, orthopnea, palpitations, peripheral edema, PND, shortness of breath or sweats. Agents associated with hypertension include oral contraceptives. Risk factors for coronary artery disease include sedentary lifestyle and obesity. Past treatments include ACE inhibitors and diuretics. The current treatment provides moderate improvement. There are no compliance problems.   reports increased anxiety today due to crying child and decreased sleep.  Report home BP readings 120s/80s.  Outpatient Medications Prior to Visit  Medication Sig Dispense Refill  . cetirizine (ZYRTEC) 10 MG tablet Take 10 mg by mouth daily as needed for allergies. Reported on 01/21/2016    . diphenhydrAMINE (BENADRYL) 50 MG capsule Take 50 mg by mouth every 6 (six) hours as needed for allergies. Reported on 01/21/2016    . ibuprofen (ADVIL,MOTRIN) 600 MG tablet Take 1 tablet (600 mg total) by mouth every 6 (six) hours as needed for pain. 90 tablet 0  . Norgestimate-Ethinyl Estradiol Triphasic (TRI-LO-SPRINTEC) 0.18/0.215/0.25 MG-25 MCG tab Take 1 tablet by mouth daily. 1 Package 11  . lisinopril-hydrochlorothiazide (PRINZIDE,ZESTORETIC) 20-12.5 MG tablet Take 1 tablet by mouth daily. 90 tablet 1   No facility-administered medications prior to visit.     ROS See HPI  Objective:  BP (!) 164/104   Pulse (!) 110   Temp 97.6 F (36.4 C)   Ht 5\' 3"  (1.6 m)   Wt 191 lb (86.6 kg)   SpO2 100%   BMI 33.83 kg/m   BP Readings from Last 3 Encounters:  10/06/16 (!) 164/104  06/30/16 (!) 152/86  03/24/16 138/80    Wt Readings from Last 3  Encounters:  10/06/16 191 lb (86.6 kg)  06/30/16 187 lb (84.8 kg)  03/24/16 189 lb (85.7 kg)    Physical Exam  Constitutional: She is oriented to person, place, and time. No distress.  HENT:  Right Ear: External ear normal.  Left Ear: External ear normal.  Nose: Nose normal.  Mouth/Throat: No oropharyngeal exudate.  Eyes: No scleral icterus.  Neck: Normal range of motion. Neck supple.  Cardiovascular: Normal rate, regular rhythm and normal heart sounds.   Pulmonary/Chest: Effort normal and breath sounds normal. No respiratory distress.  Musculoskeletal: Normal range of motion. She exhibits no edema.  Neurological: She is alert and oriented to person, place, and time.  Skin: Skin is warm and dry.  Psychiatric: She has a normal mood and affect. Her behavior is normal.    Lab Results  Component Value Date   WBC 5.7 06/30/2016   HGB 11.7 (L) 06/30/2016   HCT 34.8 (L) 06/30/2016   PLT 233.0 06/30/2016   GLUCOSE 95 10/06/2016   CHOL 169 06/30/2016   TRIG 70.0 06/30/2016   HDL 68.70 06/30/2016   LDLCALC 86 06/30/2016   ALT 24 06/30/2016   AST 21 06/30/2016   NA 136 10/06/2016   K 3.0 (L) 10/06/2016   CL 101 10/06/2016   CREATININE 0.68 10/06/2016   BUN 9 10/06/2016   CO2 29 10/06/2016   TSH 1.45 06/30/2016    No results found.  Assessment & Plan:   [name] was seen today for follow-up.  Diagnoses and all orders for this  visit:  Essential hypertension, benign -     Basic Metabolic Panel (BMET); Future -     lisinopril-hydrochlorothiazide (PRINZIDE,ZESTORETIC) 20-12.5 MG tablet; Take 1 tablet by mouth daily. -     potassium chloride (K-DUR) 10 MEQ tablet; Take 1 tablet (10 mEq total) by mouth daily.   I am having [name] start on potassium chloride. I am also having her maintain her ibuprofen, cetirizine, diphenhydrAMINE, Norgestimate-Ethinyl Estradiol Triphasic, and lisinopril-hydrochlorothiazide.  Meds ordered this encounter  Medications  .  lisinopril-hydrochlorothiazide (PRINZIDE,ZESTORETIC) 20-12.5 MG tablet    Sig: Take 1 tablet by mouth daily.    Dispense:  90 tablet    Refill:  1    Order Specific Question:   Supervising Provider    Answer:   PLOTNIKOV, ALEKSEI V [[id number]]  . potassium chloride (K-DUR) 10 MEQ tablet    Sig: Take 1 tablet (10 mEq total) by mouth daily.    Dispense:  90 tablet    Refill:  1    Order Specific Question:   Supervising Provider    Answer:   Tresa GarterPLOTNIKOV, ALEKSEI V [[id number]]    Follow-up: Return in about 3 months (around 01/06/2017) for HTN.  Alysia Pennaharlotte Shaela Boer, NP

## 2016-10-06 NOTE — Progress Notes (Signed)
Pre visit review using our clinic review tool, if applicable. No additional management support is needed unless otherwise documented below in the visit note. 

## 2017-01-05 ENCOUNTER — Ambulatory Visit: Payer: BLUE CROSS/BLUE SHIELD | Admitting: Nurse Practitioner

## 2017-01-12 ENCOUNTER — Encounter: Payer: Self-pay | Admitting: Nurse Practitioner

## 2017-01-12 ENCOUNTER — Ambulatory Visit (INDEPENDENT_AMBULATORY_CARE_PROVIDER_SITE_OTHER): Payer: BLUE CROSS/BLUE SHIELD | Admitting: Nurse Practitioner

## 2017-01-12 VITALS — BP 160/92 | HR 112 | Temp 97.9°F | Resp 12 | Ht 63.0 in | Wt 187.0 lb

## 2017-01-12 DIAGNOSIS — I10 Essential (primary) hypertension: Secondary | ICD-10-CM

## 2017-01-12 NOTE — Progress Notes (Signed)
Subjective:  Patient ID: [name], female    DOB: 1981-02-22  Age: 36 y.o. MRN: [id number]  CC: Follow-up (follow up on BP, states it been running in the 120s/70-80 at home, gets nervous coming here )   HPI HTN: Home BP readings are 120s/80s Maintains a low salt diet.  Outpatient Medications Prior to Visit  Medication Sig Dispense Refill  . cetirizine (ZYRTEC) 10 MG tablet Take 10 mg by mouth daily as needed for allergies. Reported on 01/21/2016    . diphenhydrAMINE (BENADRYL) 50 MG capsule Take 50 mg by mouth every 6 (six) hours as needed for allergies. Reported on 01/21/2016    . ibuprofen (ADVIL,MOTRIN) 600 MG tablet Take 1 tablet (600 mg total) by mouth every 6 (six) hours as needed for pain. 90 tablet 0  . lisinopril-hydrochlorothiazide (PRINZIDE,ZESTORETIC) 20-12.5 MG tablet Take 1 tablet by mouth daily. 90 tablet 1  . Norgestimate-Ethinyl Estradiol Triphasic (TRI-LO-SPRINTEC) 0.18/0.215/0.25 MG-25 MCG tab Take 1 tablet by mouth daily. 1 Package 11  . potassium chloride (K-DUR) 10 MEQ tablet Take 1 tablet (10 mEq total) by mouth daily. 90 tablet 1   No facility-administered medications prior to visit.     ROS Review of Systems  Constitutional: Negative for malaise/fatigue.  Eyes:       Negative for visual changes  Respiratory: Negative for cough and shortness of breath.   Cardiovascular: Negative for chest pain, palpitations and leg swelling.  Gastrointestinal: Negative for abdominal pain and nausea.  Musculoskeletal: Negative for joint pain and myalgias.  Skin: Negative for rash.  Neurological: Negative for dizziness, sensory change and headaches.  Endo/Heme/Allergies: Does not bruise/bleed easily.    Objective:  BP (!) 160/92 (BP Location: Left Arm, Patient Position: Sitting, Cuff Size: Normal)   Pulse (!) 112   Temp 97.9 F (36.6 C) (Oral)   Resp 12   Ht 5\' 3"  (1.6 m)   Wt 187 lb (84.8 kg)   LMP 01/07/2017 (Approximate)   SpO2 95%   BMI 33.13 kg/m   BP  Readings from Last 3 Encounters:  01/12/17 (!) 160/92  10/06/16 (!) 164/104  06/30/16 (!) 152/86    Wt Readings from Last 3 Encounters:  01/12/17 187 lb (84.8 kg)  10/06/16 191 lb (86.6 kg)  06/30/16 187 lb (84.8 kg)    Physical Exam  Constitutional: She is oriented to person, place, and time. No distress.  HENT:  Right Ear: External ear normal.  Left Ear: External ear normal.  Nose: Nose normal.  Neck: Normal range of motion. Neck supple.  Cardiovascular: Normal rate, regular rhythm and normal heart sounds.   Pulmonary/Chest: Effort normal and breath sounds normal. No respiratory distress.  Abdominal: Soft. She exhibits no distension.  Musculoskeletal: Normal range of motion. She exhibits no edema.  Neurological: She is alert and oriented to person, place, and time.  Skin: Skin is warm and dry.  Psychiatric: She has a normal mood and affect. Her behavior is normal.  Vitals reviewed.   Lab Results  Component Value Date   WBC 5.7 06/30/2016   HGB 11.7 (L) 06/30/2016   HCT 34.8 (L) 06/30/2016   PLT 233.0 06/30/2016   GLUCOSE 95 10/06/2016   CHOL 169 06/30/2016   TRIG 70.0 06/30/2016   HDL 68.70 06/30/2016   LDLCALC 86 06/30/2016   ALT 24 06/30/2016   AST 21 06/30/2016   NA 136 10/06/2016   K 3.0 (L) 10/06/2016   CL 101 10/06/2016   CREATININE 0.68 10/06/2016   BUN  9 10/06/2016   CO2 29 10/06/2016   TSH 1.45 06/30/2016    No results found.  Assessment & Plan:   [name] was seen today for follow-up.  Diagnoses and all orders for this visit:  Essential hypertension, benign   I am having [name] maintain her ibuprofen, cetirizine, diphenhydrAMINE, Norgestimate-Ethinyl Estradiol Triphasic, lisinopril-hydrochlorothiazide, and potassium chloride.  No orders of the defined types were placed in this encounter.   Follow-up: Return in about 3 months (around 04/14/2017) for HTN.  Katrina Pennaharlotte Cadarius Nevares, NP

## 2017-01-12 NOTE — Patient Instructions (Signed)
Bring BP machine to next office visit.   DASH Eating Plan DASH stands for "Dietary Approaches to Stop Hypertension." The DASH eating plan is a healthy eating plan that has been shown to reduce high blood pressure (hypertension). It may also reduce your risk for type 2 diabetes, heart disease, and stroke. The DASH eating plan may also help with weight loss. What are tips for following this plan? General guidelines  Avoid eating more than 2,300 mg (milligrams) of salt (sodium) a day. If you have hypertension, you may need to reduce your sodium intake to 1,500 mg a day.  Limit alcohol intake to no more than 1 drink a day for nonpregnant women and 2 drinks a day for men. One drink equals 12 oz of beer, 5 oz of wine, or 1 oz of hard liquor.  Work with your health care provider to maintain a healthy body weight or to lose weight. Ask what an ideal weight is for you.  Get at least 30 minutes of exercise that causes your heart to beat faster (aerobic exercise) most days of the week. Activities may include walking, swimming, or biking.  Work with your health care provider or diet and nutrition specialist (dietitian) to adjust your eating plan to your individual calorie needs. Reading food labels  Check food labels for the amount of sodium per serving. Choose foods with less than 5 percent of the Daily Value of sodium. Generally, foods with less than 300 mg of sodium per serving fit into this eating plan.  To find whole grains, look for the word "whole" as the first word in the ingredient list. Shopping  Buy products labeled as "low-sodium" or "no salt added."  Buy fresh foods. Avoid canned foods and premade or frozen meals. Cooking  Avoid adding salt when cooking. Use salt-free seasonings or herbs instead of table salt or sea salt. Check with your health care provider or pharmacist before using salt substitutes.  Do not fry foods. Cook foods using healthy methods such as baking, boiling,  grilling, and broiling instead.  Cook with heart-healthy oils, such as olive, canola, soybean, or sunflower oil. Meal planning   Eat a balanced diet that includes: ? 5 or more servings of fruits and vegetables each day. At each meal, try to fill half of your plate with fruits and vegetables. ? Up to 6-8 servings of whole grains each day. ? Less than 6 oz of lean meat, poultry, or fish each day. A 3-oz serving of meat is about the same size as a deck of cards. One egg equals 1 oz. ? 2 servings of low-fat dairy each day. ? A serving of nuts, seeds, or beans 5 times each week. ? Heart-healthy fats. Healthy fats called Omega-3 fatty acids are found in foods such as flaxseeds and coldwater fish, like sardines, salmon, and mackerel.  Limit how much you eat of the following: ? Canned or prepackaged foods. ? Food that is high in trans fat, such as fried foods. ? Food that is high in saturated fat, such as fatty meat. ? Sweets, desserts, sugary drinks, and other foods with added sugar. ? Full-fat dairy products.  Do not salt foods before eating.  Try to eat at least 2 vegetarian meals each week.  Eat more home-cooked food and less restaurant, buffet, and fast food.  When eating at a restaurant, ask that your food be prepared with less salt or no salt, if possible. What foods are recommended? The items listed may not be  a complete list. Talk with your dietitian about what dietary choices are best for you. Grains Whole-grain or whole-wheat bread. Whole-grain or whole-wheat pasta. Brown rice. [place]row. Bulgur. Whole-grain and low-sodium cereals. Pita bread. Low-fat, low-sodium crackers. Whole-wheat flour tortillas. Vegetables Fresh or frozen vegetables (raw, steamed, roasted, or grilled). Low-sodium or reduced-sodium tomato and vegetable juice. Low-sodium or reduced-sodium tomato sauce and tomato paste. Low-sodium or reduced-sodium canned vegetables. Fruits All fresh, dried, or frozen  fruit. Canned fruit in natural juice (without added sugar). Meat and other protein foods Skinless chicken or [place]. Ground chicken or [place]. Pork with fat trimmed off. Fish and seafood. Egg whites. Dried beans, peas, or lentils. Unsalted nuts, nut butters, and seeds. Unsalted canned beans. Lean cuts of beef with fat trimmed off. Low-sodium, lean deli meat. Dairy Low-fat (1%) or fat-free (skim) milk. Fat-free, low-fat, or reduced-fat cheeses. Nonfat, low-sodium ricotta or cottage cheese. Low-fat or nonfat yogurt. Low-fat, low-sodium cheese. Fats and oils Soft margarine without trans fats. Vegetable oil. Low-fat, reduced-fat, or light mayonnaise and salad dressings (reduced-sodium). Canola, safflower, olive, soybean, and sunflower oils. Avocado. Seasoning and other foods Herbs. Spices. Seasoning mixes without salt. Unsalted popcorn and pretzels. Fat-free sweets. What foods are not recommended? The items listed may not be a complete list. Talk with your dietitian about what dietary choices are best for you. Grains Baked goods made with fat, such as croissants, muffins, or some breads. Dry pasta or rice meal packs. Vegetables Creamed or fried vegetables. Vegetables in a cheese sauce. Regular canned vegetables (not low-sodium or reduced-sodium). Regular canned tomato sauce and paste (not low-sodium or reduced-sodium). Regular tomato and vegetable juice (not low-sodium or reduced-sodium). Angie Fava. Olives. Fruits Canned fruit in a light or heavy syrup. Fried fruit. Fruit in cream or butter sauce. Meat and other protein foods Fatty cuts of meat. Ribs. Fried meat. Berniece Salines. Sausage. Bologna and other processed lunch meats. Salami. Fatback. Hotdogs. Bratwurst. Salted nuts and seeds. Canned beans with added salt. Canned or smoked fish. Whole eggs or egg yolks. Chicken or [place] with skin. Dairy Whole or 2% milk, cream, and half-and-half. Whole or full-fat cream cheese. Whole-fat or sweetened yogurt. Full-fat  cheese. Nondairy creamers. Whipped toppings. Processed cheese and cheese spreads. Fats and oils Butter. Stick margarine. Lard. Shortening. Ghee. Bacon fat. Tropical oils, such as coconut, palm kernel, or palm oil. Seasoning and other foods Salted popcorn and pretzels. Onion salt, garlic salt, seasoned salt, table salt, and sea salt. Worcestershire sauce. Tartar sauce. Barbecue sauce. Teriyaki sauce. Soy sauce, including reduced-sodium. Steak sauce. Canned and packaged gravies. Fish sauce. Oyster sauce. Cocktail sauce. Horseradish that you find on the shelf. Ketchup. Mustard. Meat flavorings and tenderizers. Bouillon cubes. Hot sauce and Tabasco sauce. Premade or packaged marinades. Premade or packaged taco seasonings. Relishes. Regular salad dressings. Where to find more information:  National Heart, Lung, and [place]: https://wilson-eaton.com/  American Heart Association: www.heart.org Summary  The DASH eating plan is a healthy eating plan that has been shown to reduce high blood pressure (hypertension). It may also reduce your risk for type 2 diabetes, heart disease, and stroke.  With the DASH eating plan, you should limit salt (sodium) intake to 2,300 mg a day. If you have hypertension, you may need to reduce your sodium intake to 1,500 mg a day.  When on the DASH eating plan, aim to eat more fresh fruits and vegetables, whole grains, lean proteins, low-fat dairy, and heart-healthy fats.  Work with your health care provider or diet and nutrition specialist (  dietitian) to adjust your eating plan to your individual calorie needs. This information is not intended to replace advice given to you by your health care provider. Make sure you discuss any questions you have with your health care provider. Document Released: 06/23/2011 Document Revised: 06/27/2016 Document Reviewed: 06/27/2016 Elsevier Interactive Patient Education  2017 [employer].

## 2017-02-06 ENCOUNTER — Telehealth: Payer: Self-pay | Admitting: Nurse Practitioner

## 2017-02-06 DIAGNOSIS — Z30011 Encounter for initial prescription of contraceptive pills: Secondary | ICD-10-CM

## 2017-02-06 MED ORDER — NORGESTIM-ETH ESTRAD TRIPHASIC 0.18/0.215/0.25 MG-25 MCG PO TABS: 1.0000 | ORAL_TABLET | Freq: Every day | ORAL | 11 refills | Status: DC

## 2017-02-06 NOTE — Telephone Encounter (Signed)
Patient is requesting refill on norgestimate (birth control) to be sent to [facility] on [address].

## 2017-02-06 NOTE — Telephone Encounter (Signed)
rx sent

## 2017-02-10 IMAGING — DX DG CHEST 2V
2 series · 2 of 2 positions shown · non-contrast
Comparison: None.

CLINICAL DATA: Right-sided chest pressure, onset tonight.

EXAM:
CHEST  2 VIEW

[chest pa]
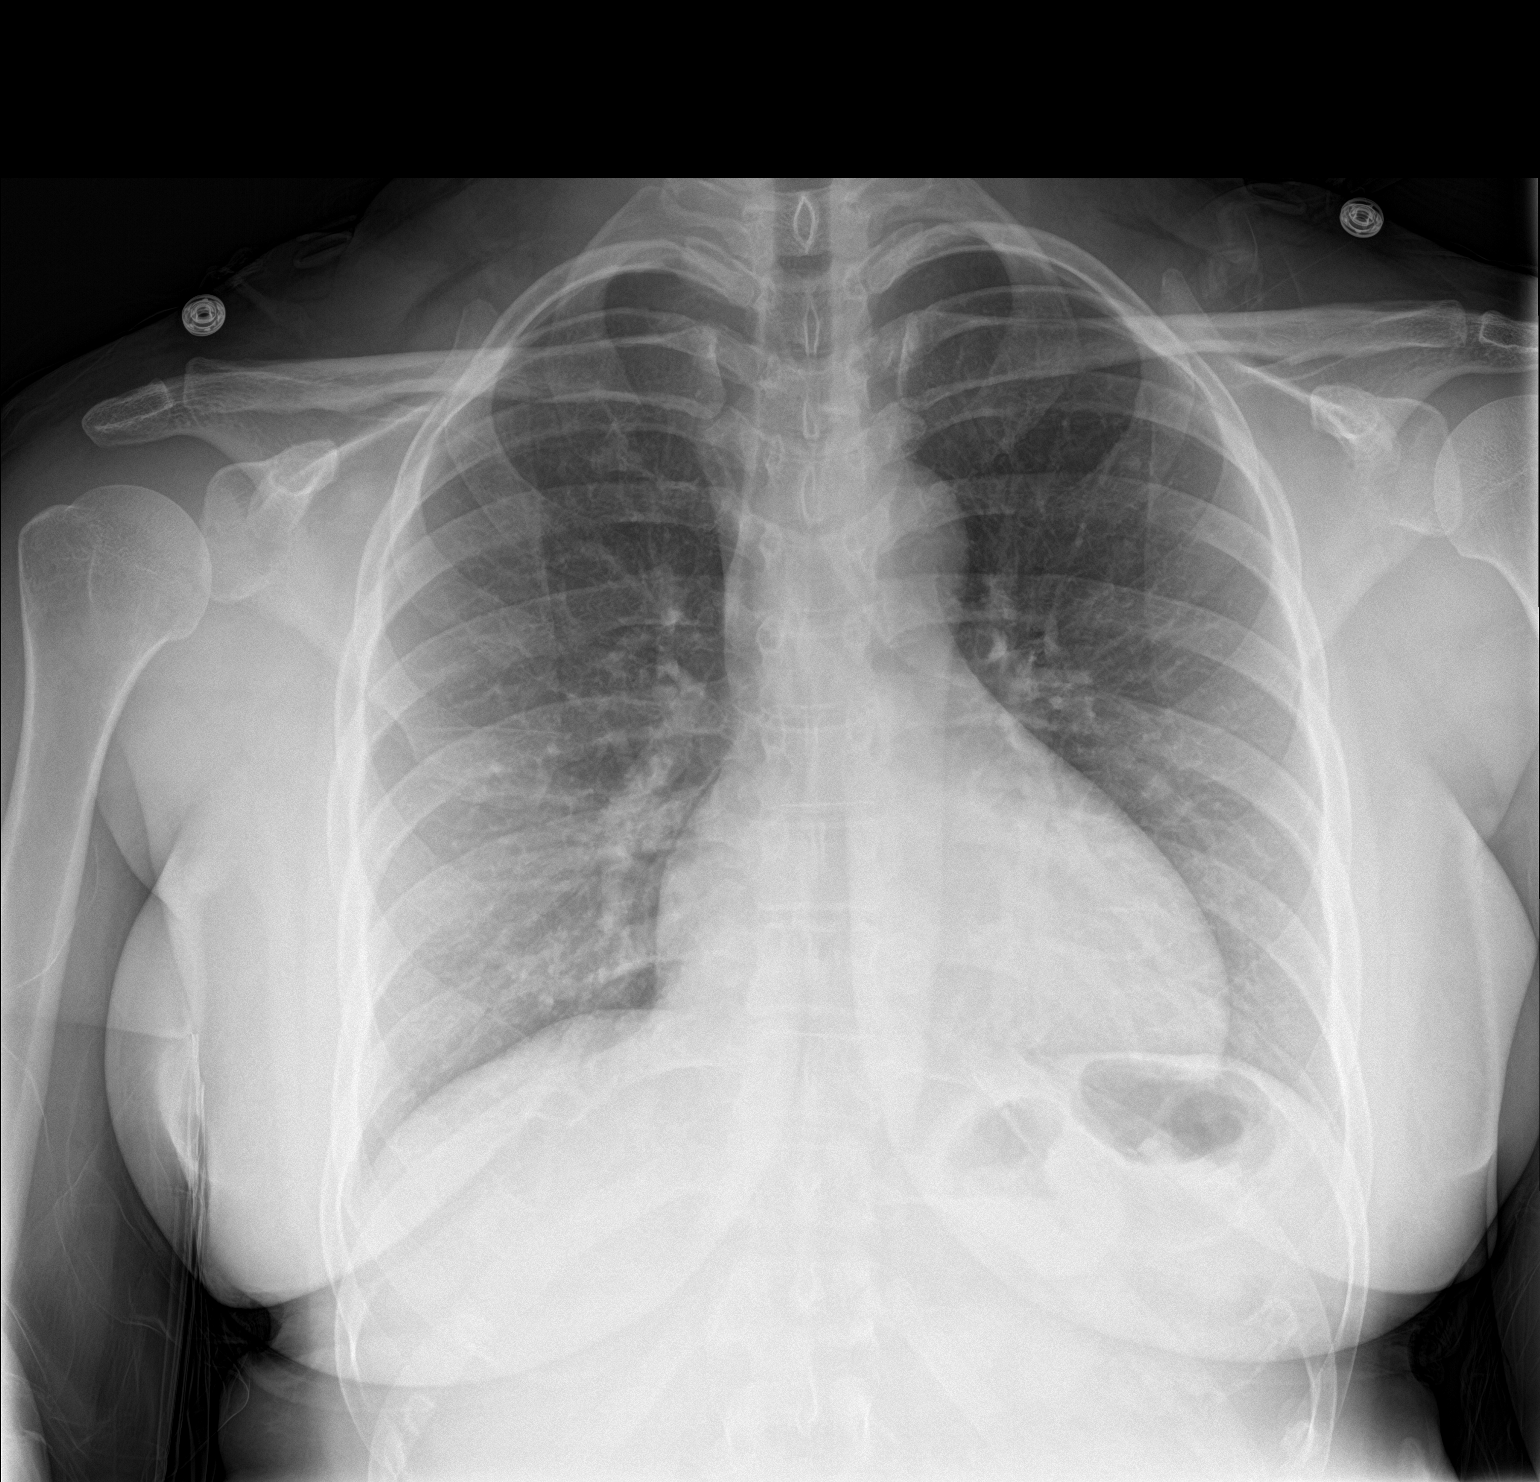

[chest lat]
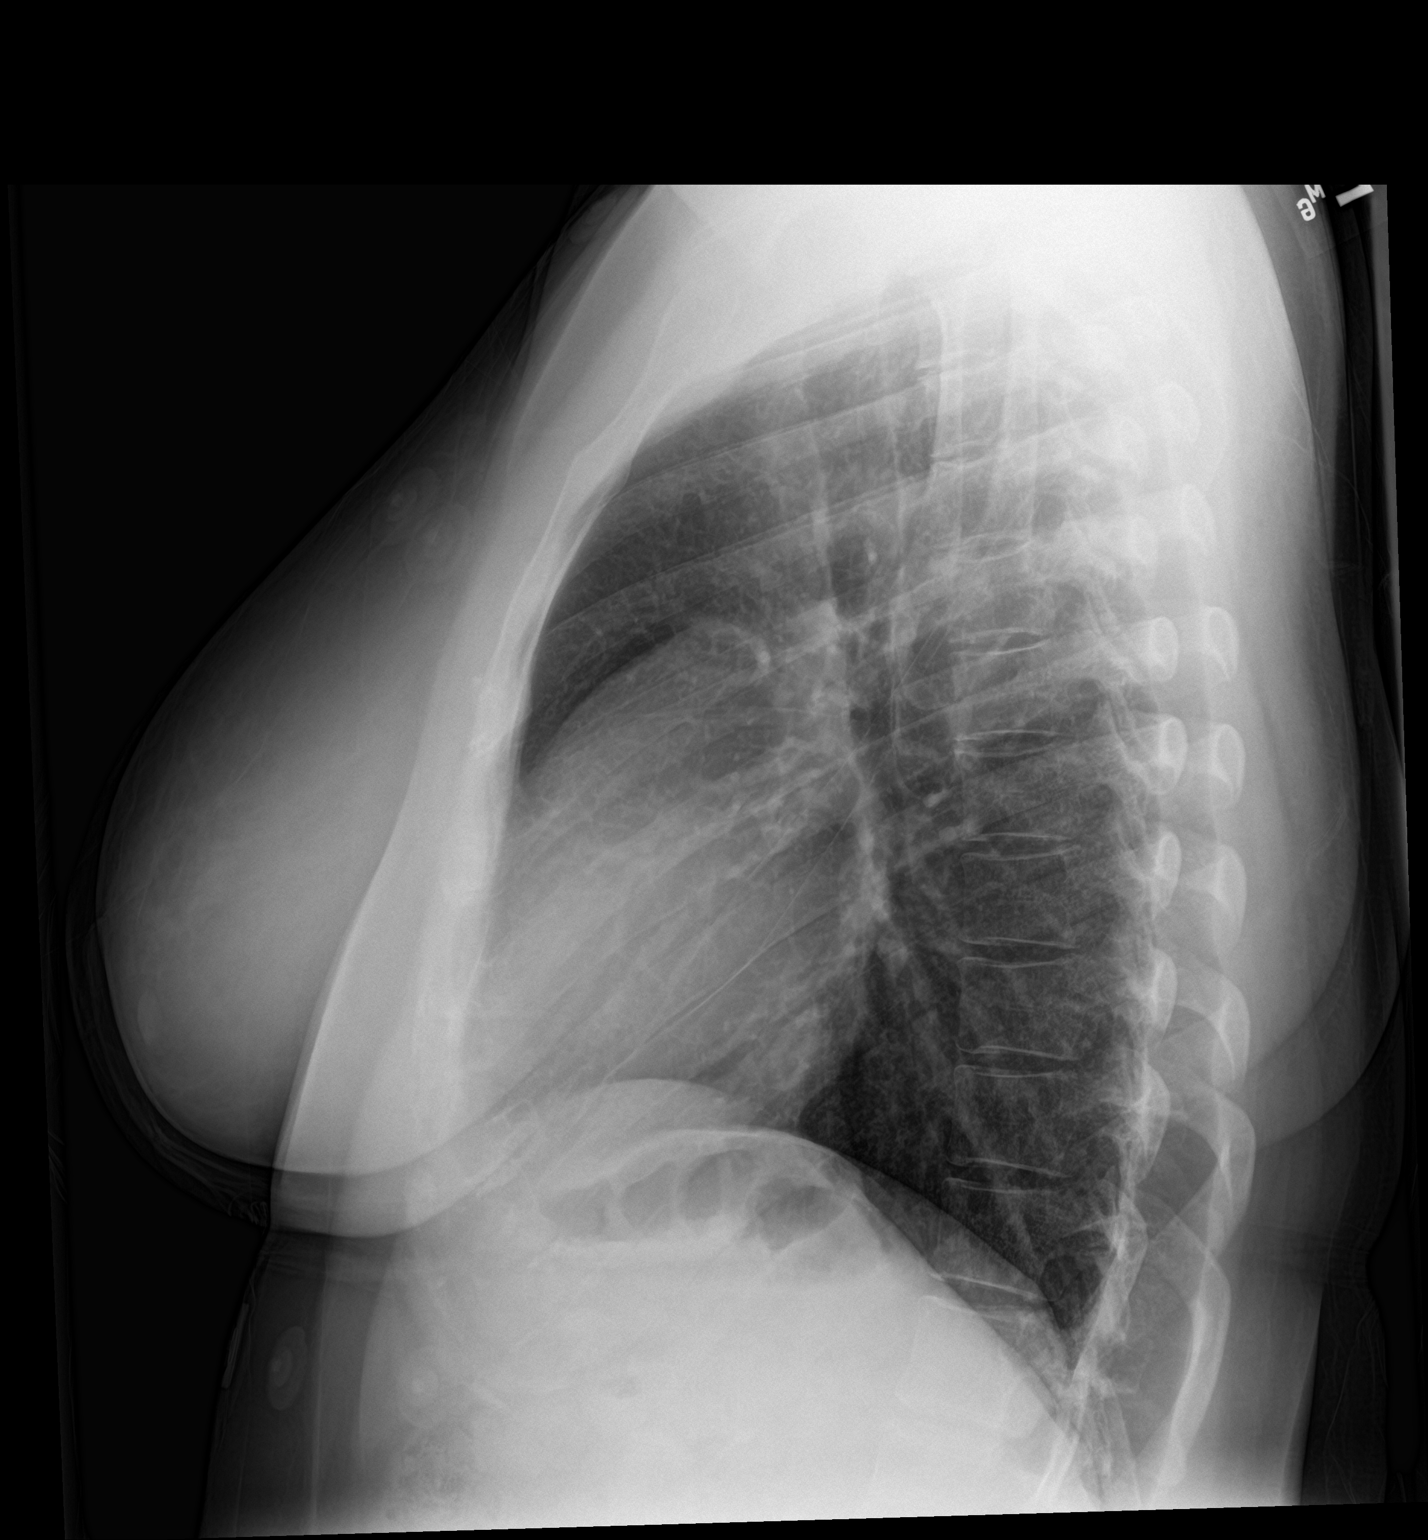

[2 of 2 positions shown; findings below may reference images not displayed]

FINDINGS: The lungs are clear. The pulmonary vasculature is normal. Heart size
is normal. Hilar and mediastinal contours are unremarkable. There is
no pleural effusion.
IMPRESSION: No active cardiopulmonary disease.

## 2017-03-28 ENCOUNTER — Telehealth: Payer: Self-pay | Admitting: Nurse Practitioner

## 2017-03-28 DIAGNOSIS — I10 Essential (primary) hypertension: Secondary | ICD-10-CM

## 2017-03-28 MED ORDER — POTASSIUM CHLORIDE ER 10 MEQ PO TBCR: 10.0000 meq | EXTENDED_RELEASE_TABLET | Freq: Every day | ORAL | 0 refills | Status: DC

## 2017-03-28 NOTE — Telephone Encounter (Signed)
Pt called requesting a refill on potassium chloride (K-DUR) 10 MEQ tablet to [facility] on [address].

## 2017-03-28 NOTE — Telephone Encounter (Signed)
Reviewed chart pt is up-to-date sent refills to [facility].../lmb  

## 2017-04-13 ENCOUNTER — Ambulatory Visit: Payer: BLUE CROSS/BLUE SHIELD | Admitting: Nurse Practitioner

## 2017-04-20 ENCOUNTER — Ambulatory Visit: Payer: BLUE CROSS/BLUE SHIELD | Admitting: Nurse Practitioner

## 2017-04-26 ENCOUNTER — Ambulatory Visit (INDEPENDENT_AMBULATORY_CARE_PROVIDER_SITE_OTHER): Payer: BLUE CROSS/BLUE SHIELD | Admitting: Nurse Practitioner

## 2017-04-26 ENCOUNTER — Other Ambulatory Visit (INDEPENDENT_AMBULATORY_CARE_PROVIDER_SITE_OTHER): Payer: BLUE CROSS/BLUE SHIELD

## 2017-04-26 ENCOUNTER — Encounter: Payer: Self-pay | Admitting: Nurse Practitioner

## 2017-04-26 VITALS — BP 160/88 | HR 114 | Temp 98.6°F | Ht 63.0 in | Wt 184.0 lb

## 2017-04-26 DIAGNOSIS — I10 Essential (primary) hypertension: Secondary | ICD-10-CM

## 2017-04-26 DIAGNOSIS — D649 Anemia, unspecified: Secondary | ICD-10-CM

## 2017-04-26 DIAGNOSIS — Z3041 Encounter for surveillance of contraceptive pills: Secondary | ICD-10-CM

## 2017-04-26 LAB — CBC WITH DIFFERENTIAL/PLATELET
BASOS ABS: 0 10*3/uL (ref 0.0–0.1)
BASOS PCT: 0.9 % (ref 0.0–3.0)
EOS ABS: 0 10*3/uL (ref 0.0–0.7)
Eosinophils Relative: 0.8 % (ref 0.0–5.0)
HEMATOCRIT: 39.1 % (ref 36.0–46.0)
Hemoglobin: 13 g/dL (ref 12.0–15.0)
LYMPHS ABS: 1.7 10*3/uL (ref 0.7–4.0)
LYMPHS PCT: 32.1 % (ref 12.0–46.0)
MCHC: 33.2 g/dL (ref 30.0–36.0)
MCV: 84.6 fl (ref 78.0–100.0)
MONO ABS: 0.5 10*3/uL (ref 0.1–1.0)
Monocytes Relative: 9.8 % (ref 3.0–12.0)
NEUTROS ABS: 3 10*3/uL (ref 1.4–7.7)
NEUTROS PCT: 56.4 % (ref 43.0–77.0)
PLATELETS: 284 10*3/uL (ref 150.0–400.0)
RBC: 4.62 Mil/uL (ref 3.87–5.11)
RDW: 13.9 % (ref 11.5–15.5)
WBC: 5.4 10*3/uL (ref 4.0–10.5)

## 2017-04-26 LAB — BASIC METABOLIC PANEL
BUN: 8 mg/dL (ref 6–23)
CALCIUM: 9.7 mg/dL (ref 8.4–10.5)
CHLORIDE: 100 meq/L (ref 96–112)
CO2: 30 meq/L (ref 19–32)
CREATININE: 0.73 mg/dL (ref 0.40–1.20)
GFR: 115 mL/min (ref >60.00)
GLUCOSE: 97 mg/dL (ref 70–99)
Potassium: 3.5 mEq/L (ref 3.5–5.1)
Sodium: 137 mEq/L (ref 135–145)

## 2017-04-26 LAB — IBC PANEL
IRON: 56 ug/dL (ref 42–145)
SATURATION RATIOS: 11.9 % — AB (ref 20.0–50.0)
TRANSFERRIN: 337 mg/dL (ref 212.0–360.0)

## 2017-04-26 MED ORDER — NORGESTIM-ETH ESTRAD TRIPHASIC 0.18/0.215/0.25 MG-25 MCG PO TABS: 1.0000 | ORAL_TABLET | Freq: Every day | ORAL | 3 refills | Status: DC

## 2017-04-26 MED ORDER — METOPROLOL SUCCINATE ER 50 MG PO TB24: 50.0000 mg | ORAL_TABLET | Freq: Every day | ORAL | 1 refills | Status: DC

## 2017-04-26 MED ORDER — FERROUS SULFATE 325 (65 FE) MG PO TABS: 325.0000 mg | ORAL_TABLET | Freq: Every day | ORAL | 3 refills | Status: DC

## 2017-04-26 MED ORDER — LISINOPRIL-HYDROCHLOROTHIAZIDE 20-12.5 MG PO TABS: 1.0000 | ORAL_TABLET | Freq: Every day | ORAL | 1 refills | Status: DC

## 2017-04-26 NOTE — Patient Instructions (Signed)
Go to basement for blood draw  Please improve diet to low fat, low carb and low sodium diet.  Please bring BP machine to next OV.   DASH Eating Plan DASH stands for "Dietary Approaches to Stop Hypertension." The DASH eating plan is a healthy eating plan that has been shown to reduce high blood pressure (hypertension). It may also reduce your risk for type 2 diabetes, heart disease, and stroke. The DASH eating plan may also help with weight loss. What are tips for following this plan? General guidelines  Avoid eating more than 2,300 mg (milligrams) of salt (sodium) a day. If you have hypertension, you may need to reduce your sodium intake to 1,500 mg a day.  Limit alcohol intake to no more than 1 drink a day for nonpregnant women and 2 drinks a day for men. One drink equals 12 oz of beer, 5 oz of wine, or 1 oz of hard liquor.  Work with your health care provider to maintain a healthy body weight or to lose weight. Ask what an ideal weight is for you.  Get at least 30 minutes of exercise that causes your heart to beat faster (aerobic exercise) most days of the week. Activities may include walking, swimming, or biking.  Work with your health care provider or diet and nutrition specialist (dietitian) to adjust your eating plan to your individual calorie needs. Reading food labels  Check food labels for the amount of sodium per serving. Choose foods with less than 5 percent of the Daily Value of sodium. Generally, foods with less than 300 mg of sodium per serving fit into this eating plan.  To find whole grains, look for the word "whole" as the first word in the ingredient list. Shopping  Buy products labeled as "low-sodium" or "no salt added."  Buy fresh foods. Avoid canned foods and premade or frozen meals. Cooking  Avoid adding salt when cooking. Use salt-free seasonings or herbs instead of table salt or sea salt. Check with your health care provider or pharmacist before using salt  substitutes.  Do not fry foods. Cook foods using healthy methods such as baking, boiling, grilling, and broiling instead.  Cook with heart-healthy oils, such as olive, canola, soybean, or sunflower oil. Meal planning   Eat a balanced diet that includes: ? 5 or more servings of fruits and vegetables each day. At each meal, try to fill half of your plate with fruits and vegetables. ? Up to 6-8 servings of whole grains each day. ? Less than 6 oz of lean meat, poultry, or fish each day. A 3-oz serving of meat is about the same size as a deck of cards. One egg equals 1 oz. ? 2 servings of low-fat dairy each day. ? A serving of nuts, seeds, or beans 5 times each week. ? Heart-healthy fats. Healthy fats called Omega-3 fatty acids are found in foods such as flaxseeds and coldwater fish, like sardines, salmon, and mackerel.  Limit how much you eat of the following: ? Canned or prepackaged foods. ? Food that is high in trans fat, such as fried foods. ? Food that is high in saturated fat, such as fatty meat. ? Sweets, desserts, sugary drinks, and other foods with added sugar. ? Full-fat dairy products.  Do not salt foods before eating.  Try to eat at least 2 vegetarian meals each week.  Eat more home-cooked food and less restaurant, buffet, and fast food.  When eating at a restaurant, ask that your food  be prepared with less salt or no salt, if possible. What foods are recommended? The items listed may not be a complete list. Talk with your dietitian about what dietary choices are best for you. Grains Whole-grain or whole-wheat bread. Whole-grain or whole-wheat pasta. Brown rice. Modena Morrow. Bulgur. Whole-grain and low-sodium cereals. Pita bread. Low-fat, low-sodium crackers. Whole-wheat flour tortillas. Vegetables Fresh or frozen vegetables (raw, steamed, roasted, or grilled). Low-sodium or reduced-sodium tomato and vegetable juice. Low-sodium or reduced-sodium tomato sauce and tomato  paste. Low-sodium or reduced-sodium canned vegetables. Fruits All fresh, dried, or frozen fruit. Canned fruit in natural juice (without added sugar). Meat and other protein foods Skinless chicken or [place]. Ground chicken or [place]. Pork with fat trimmed off. Fish and seafood. Egg whites. Dried beans, peas, or lentils. Unsalted nuts, nut butters, and seeds. Unsalted canned beans. Lean cuts of beef with fat trimmed off. Low-sodium, lean deli meat. Dairy Low-fat (1%) or fat-free (skim) milk. Fat-free, low-fat, or reduced-fat cheeses. Nonfat, low-sodium ricotta or cottage cheese. Low-fat or nonfat yogurt. Low-fat, low-sodium cheese. Fats and oils Soft margarine without trans fats. Vegetable oil. Low-fat, reduced-fat, or light mayonnaise and salad dressings (reduced-sodium). Canola, safflower, olive, soybean, and sunflower oils. Avocado. Seasoning and other foods Herbs. Spices. Seasoning mixes without salt. Unsalted popcorn and pretzels. Fat-free sweets. What foods are not recommended? The items listed may not be a complete list. Talk with your dietitian about what dietary choices are best for you. Grains Baked goods made with fat, such as croissants, muffins, or some breads. Dry pasta or rice meal packs. Vegetables Creamed or fried vegetables. Vegetables in a cheese sauce. Regular canned vegetables (not low-sodium or reduced-sodium). Regular canned tomato sauce and paste (not low-sodium or reduced-sodium). Regular tomato and vegetable juice (not low-sodium or reduced-sodium). Angie Fava. Olives. Fruits Canned fruit in a light or heavy syrup. Fried fruit. Fruit in cream or butter sauce. Meat and other protein foods Fatty cuts of meat. Ribs. Fried meat. Berniece Salines. Sausage. Bologna and other processed lunch meats. Salami. Fatback. Hotdogs. Bratwurst. Salted nuts and seeds. Canned beans with added salt. Canned or smoked fish. Whole eggs or egg yolks. Chicken or [place] with skin. Dairy Whole or 2% milk,  cream, and half-and-half. Whole or full-fat cream cheese. Whole-fat or sweetened yogurt. Full-fat cheese. Nondairy creamers. Whipped toppings. Processed cheese and cheese spreads. Fats and oils Butter. Stick margarine. Lard. Shortening. Ghee. Bacon fat. Tropical oils, such as coconut, palm kernel, or palm oil. Seasoning and other foods Salted popcorn and pretzels. Onion salt, garlic salt, seasoned salt, table salt, and sea salt. Worcestershire sauce. Tartar sauce. Barbecue sauce. Teriyaki sauce. Soy sauce, including reduced-sodium. Steak sauce. Canned and packaged gravies. Fish sauce. Oyster sauce. Cocktail sauce. Horseradish that you find on the shelf. Ketchup. Mustard. Meat flavorings and tenderizers. Bouillon cubes. Hot sauce and Tabasco sauce. Premade or packaged marinades. Premade or packaged taco seasonings. Relishes. Regular salad dressings. Where to find more information:  National Heart, Lung, and Diamond: https://wilson-eaton.com/  American Heart Association: www.heart.org Summary  The DASH eating plan is a healthy eating plan that has been shown to reduce high blood pressure (hypertension). It may also reduce your risk for type 2 diabetes, heart disease, and stroke.  With the DASH eating plan, you should limit salt (sodium) intake to 2,300 mg a day. If you have hypertension, you may need to reduce your sodium intake to 1,500 mg a day.  When on the DASH eating plan, aim to eat more fresh fruits and vegetables, whole  grains, lean proteins, low-fat dairy, and heart-healthy fats.  Work with your health care provider or diet and nutrition specialist (dietitian) to adjust your eating plan to your individual calorie needs. This information is not intended to replace advice given to you by your health care provider. Make sure you discuss any questions you have with your health care provider. Document Released: 06/23/2011 Document Revised: 06/27/2016 Document Reviewed: 06/27/2016 Elsevier  Interactive Patient Education  2017 [employer].

## 2017-04-26 NOTE — Progress Notes (Signed)
Subjective:  Patient ID: [name], female    DOB: 10-30-1980  Age: 36 y.o. MRN: [id number]  CC: Follow-up (3 mo fu--check BP every other day 120/80 (estimate)/ refill on birthcontrol and BP med. )   HPI HTN: Uncontrolled with lisinopril/HCTZ. BP Readings from Last 3 Encounters:  04/26/17 (!) 160/88  01/12/17 (!) 160/92  10/06/16 (!) 164/104   Outpatient Medications Prior to Visit  Medication Sig Dispense Refill  . cetirizine (ZYRTEC) 10 MG tablet Take 10 mg by mouth daily as needed for allergies. Reported on 01/21/2016    . diphenhydrAMINE (BENADRYL) 50 MG capsule Take 50 mg by mouth every 6 (six) hours as needed for allergies. Reported on 01/21/2016    . ibuprofen (ADVIL,MOTRIN) 600 MG tablet Take 1 tablet (600 mg total) by mouth every 6 (six) hours as needed for pain. 90 tablet 0  . potassium chloride (K-DUR) 10 MEQ tablet Take 1 tablet (10 mEq total) by mouth daily. Annual appt due in Dec must see provider for future refills 90 tablet 0  . lisinopril-hydrochlorothiazide (PRINZIDE,ZESTORETIC) 20-12.5 MG tablet Take 1 tablet by mouth daily. 90 tablet 1  . Norgestimate-Ethinyl Estradiol Triphasic (TRI-LO-SPRINTEC) 0.18/0.215/0.25 MG-25 MCG tab Take 1 tablet by mouth daily. 1 Package 11   No facility-administered medications prior to visit.     ROS Review of Systems  Constitutional: Positive for malaise/fatigue. Negative for chills, diaphoresis, fever and weight loss.  Respiratory: Negative for shortness of breath.   Cardiovascular: Negative for chest pain, palpitations, orthopnea, leg swelling and PND.  Gastrointestinal: Negative for abdominal pain and nausea.  Musculoskeletal: Negative for falls and joint pain.  Skin: Negative.   Neurological: Negative for dizziness, weakness and headaches.    Objective:  BP (!) 160/88   Pulse (!) 114   Temp 98.6 F (37 C)   Ht  (1.6 m)   Wt 184 lb (83.5 kg)   SpO2 99%   Breastfeeding? No   BMI 32.59 kg/m   BP Readings from  Last 3 Encounters:  04/26/17 (!) 160/88  01/12/17 (!) 160/92  10/06/16 (!) 164/104    Wt Readings from Last 3 Encounters:  04/26/17 184 lb (83.5 kg)  01/12/17 187 lb (84.8 kg)  10/06/16 191 lb (86.6 kg)    Physical Exam  Constitutional: She is oriented to person, place, and time. No distress.  Neck: No JVD present. No thyromegaly present.  Cardiovascular: Normal rate and regular rhythm.  Exam reveals gallop.   Pulmonary/Chest: Effort normal and breath sounds normal.  Musculoskeletal: She exhibits no edema.  Neurological: She is alert and oriented to person, place, and time.  Psychiatric: She has a normal mood and affect. Her behavior is normal.  Vitals reviewed.   Lab Results  Component Value Date   WBC 5.4 04/26/2017   HGB 13.0 04/26/2017   HCT 39.1 04/26/2017   PLT 284.0 04/26/2017   GLUCOSE 97 04/26/2017   CHOL 169 06/30/2016   TRIG 70.0 06/30/2016   HDL 68.70 06/30/2016   LDLCALC 86 06/30/2016   ALT 24 06/30/2016   AST 21 06/30/2016   NA 137 04/26/2017   K 3.5 04/26/2017   CL 100 04/26/2017   CREATININE 0.73 04/26/2017   BUN 8 04/26/2017   CO2 30 04/26/2017   TSH 1.45 06/30/2016    Assessment & Plan:   [name] was seen today for follow-up.  Diagnoses and all orders for this visit:  Essential hypertension, benign -     Basic metabolic panel; Future -  metoprolol succinate (TOPROL-XL) 50 MG 24 hr tablet; Take 1 tablet (50 mg total) by mouth at bedtime. Take with or immediately following a meal. -     lisinopril-hydrochlorothiazide (PRINZIDE,ZESTORETIC) 20-12.5 MG tablet; Take 1 tablet by mouth daily.  Encounter for surveillance of contraceptive pills -     Norgestimate-Ethinyl Estradiol Triphasic (TRI-LO-SPRINTEC) 0.18/0.215/0.25 MG-25 MCG tab; Take 1 tablet by mouth daily.  Anemia, unspecified type -     ferrous sulfate 325 (65 FE) MG tablet; Take 1 tablet (325 mg total) by mouth daily with breakfast. -     CBC w/Diff; Future -     IBC panel;  Future   I am having [name] start on metoprolol succinate and ferrous sulfate. I am also having her maintain her ibuprofen, cetirizine, diphenhydrAMINE, potassium chloride, Fexofenadine HCl (ALLEGRA PO), Norgestimate-Ethinyl Estradiol Triphasic, and lisinopril-hydrochlorothiazide.  Meds ordered this encounter  Medications  . Fexofenadine HCl (ALLEGRA PO)    Sig: Take by mouth.  . metoprolol succinate (TOPROL-XL) 50 MG 24 hr tablet    Sig: Take 1 tablet (50 mg total) by mouth at bedtime. Take with or immediately following a meal.    Dispense:  30 tablet    Refill:  1    Order Specific Question:   Supervising Provider    Answer:   Tresa Garter [[id number]]  . Norgestimate-Ethinyl Estradiol Triphasic (TRI-LO-SPRINTEC) 0.18/0.215/0.25 MG-25 MCG tab    Sig: Take 1 tablet by mouth daily.    Dispense:  3 Package    Refill:  3    Order Specific Question:   Supervising Provider    Answer:   Tresa Garter [[id number]]  . ferrous sulfate 325 (65 FE) MG tablet    Sig: Take 1 tablet (325 mg total) by mouth daily with breakfast.    Refill:  3    Order Specific Question:   Supervising Provider    Answer:   Tresa Garter [[id number]]  . lisinopril-hydrochlorothiazide (PRINZIDE,ZESTORETIC) 20-12.5 MG tablet    Sig: Take 1 tablet by mouth daily.    Dispense:  90 tablet    Refill:  1    Order Specific Question:   Supervising Provider    Answer:   Tresa Garter [[id number]]    Follow-up: Return in about 4 weeks (around 05/24/2017) for HTN.  Alysia Penna, NP

## 2017-05-18 ENCOUNTER — Ambulatory Visit (INDEPENDENT_AMBULATORY_CARE_PROVIDER_SITE_OTHER): Payer: BLUE CROSS/BLUE SHIELD | Admitting: Nurse Practitioner

## 2017-05-18 ENCOUNTER — Encounter: Payer: Self-pay | Admitting: Nurse Practitioner

## 2017-05-18 ENCOUNTER — Other Ambulatory Visit: Payer: BLUE CROSS/BLUE SHIELD

## 2017-05-18 VITALS — BP 162/80 | HR 119 | Temp 98.2°F | Ht 63.0 in | Wt 183.0 lb

## 2017-05-18 DIAGNOSIS — I10 Essential (primary) hypertension: Secondary | ICD-10-CM

## 2017-05-18 DIAGNOSIS — R000 Tachycardia, unspecified: Secondary | ICD-10-CM | POA: Diagnosis not present

## 2017-05-18 DIAGNOSIS — I1A0 Resistant hypertension: Secondary | ICD-10-CM

## 2017-05-18 MED ORDER — METOPROLOL SUCCINATE ER 50 MG PO TB24: 50.0000 mg | ORAL_TABLET | Freq: Every day | ORAL | 1 refills | Status: DC

## 2017-05-18 MED ORDER — POTASSIUM CHLORIDE ER 10 MEQ PO TBCR: 10.0000 meq | EXTENDED_RELEASE_TABLET | Freq: Every day | ORAL | 1 refills | Status: DC

## 2017-05-18 NOTE — Progress Notes (Signed)
Subjective:  Patient ID: [name], female    DOB: Apr 24, 1981  Age: 36 y.o. MRN: [id number]  CC: Follow-up (4 wk follow up/ 122/80 yesterday. )   HPI  HTN: Elevated readings in office, but she reports reading in 120s/70s at home. States she forgot to bring machine today. Reports she has made changes to [name] diet, but has a hard time incorporating exercise at this time. Denies any adverse effect with lisinopril/HCTZ and metoprolol. FH of HTN, no known CAD or CVA. EKG done 12/2015: NSR, no indication of LVH. BP Readings from Last 3 Encounters:  05/18/17 (!) 162/80  04/26/17 (!) 160/88  01/12/17 (!) 160/92    Outpatient Medications Prior to Visit  Medication Sig Dispense Refill  . cetirizine (ZYRTEC) 10 MG tablet Take 10 mg by mouth daily as needed for allergies. Reported on 01/21/2016    . diphenhydrAMINE (BENADRYL) 50 MG capsule Take 50 mg by mouth every 6 (six) hours as needed for allergies. Reported on 01/21/2016    . ferrous sulfate 325 (65 FE) MG tablet Take 1 tablet (325 mg total) by mouth daily with breakfast.  3  . Fexofenadine HCl (ALLEGRA PO) Take by mouth.    Marland Kitchen. ibuprofen (ADVIL,MOTRIN) 600 MG tablet Take 1 tablet (600 mg total) by mouth every 6 (six) hours as needed for pain. 90 tablet 0  . lisinopril-hydrochlorothiazide (PRINZIDE,ZESTORETIC) 20-12.5 MG tablet Take 1 tablet by mouth daily. 90 tablet 1  . Norgestimate-Ethinyl Estradiol Triphasic (TRI-LO-SPRINTEC) 0.18/0.215/0.25 MG-25 MCG tab Take 1 tablet by mouth daily. 3 Package 3  . metoprolol succinate (TOPROL-XL) 50 MG 24 hr tablet Take 1 tablet (50 mg total) by mouth at bedtime. Take with or immediately following a meal. 30 tablet 1  . potassium chloride (K-DUR) 10 MEQ tablet Take 1 tablet (10 mEq total) by mouth daily. Annual appt due in Dec must see provider for future refills 90 tablet 0   No facility-administered medications prior to visit.     ROS Review of Systems  Constitutional: Negative.   Respiratory:  Negative for cough and shortness of breath.   Cardiovascular: Positive for palpitations. Negative for chest pain, orthopnea, leg swelling and PND.  Musculoskeletal: Negative for falls.  Neurological: Negative for dizziness, sensory change and headaches.  Psychiatric/Behavioral: Negative for depression and substance abuse. The patient is not nervous/anxious.     Objective:  BP (!) 162/80   Pulse (!) 119   Temp 98.2 F (36.8 C)   Ht 5\' 3"  (1.6 m)   Wt 183 lb (83 kg)   SpO2 98%   BMI 32.42 kg/m   BP Readings from Last 3 Encounters:  05/18/17 (!) 162/80  04/26/17 (!) 160/88  01/12/17 (!) 160/92    Wt Readings from Last 3 Encounters:  05/18/17 183 lb (83 kg)  04/26/17 184 lb (83.5 kg)  01/12/17 187 lb (84.8 kg)    Physical Exam  Constitutional: She is oriented to person, place, and time. No distress.  Cardiovascular: Normal rate, regular rhythm and normal heart sounds.   Pulmonary/Chest: Breath sounds normal.  Neurological: She is alert and oriented to person, place, and time.  Skin: Skin is warm and dry.  Psychiatric: She has a normal mood and affect.  Vitals reviewed.   Lab Results  Component Value Date   WBC 5.4 04/26/2017   HGB 13.0 04/26/2017   HCT 39.1 04/26/2017   PLT 284.0 04/26/2017   GLUCOSE 97 04/26/2017   CHOL 169 06/30/2016   TRIG 70.0 06/30/2016  HDL 68.70 06/30/2016   LDLCALC 86 06/30/2016   ALT 24 06/30/2016   AST 21 06/30/2016   NA 137 04/26/2017   K 3.5 04/26/2017   CL 100 04/26/2017   CREATININE 0.73 04/26/2017   BUN 8 04/26/2017   CO2 30 04/26/2017   TSH 1.45 06/30/2016    No results found.  Assessment & Plan:   [name] was seen today for follow-up.  Diagnoses and all orders for this visit:  Resistant hypertension -     Aldosterone + renin activity w/ ratio; Future -     Metanephrines, plasma; Future -     Catecholamines, fractionated, plasma; Future -     ECHOCARDIOGRAM COMPLETE; Future -     Ambulatory referral to  Neurology  Tachycardia -     ECHOCARDIOGRAM COMPLETE; Future -     Ambulatory referral to Neurology  Essential hypertension, benign -     potassium chloride (K-DUR) 10 MEQ tablet; Take 1 tablet (10 mEq total) by mouth daily. -     metoprolol succinate (TOPROL-XL) 50 MG 24 hr tablet; Take 1 tablet (50 mg total) by mouth at bedtime. Take with or immediately following a meal.   I have changed [name] potassium chloride. I am also having [name] maintain [name] ibuprofen, cetirizine, diphenhydrAMINE, Fexofenadine HCl (ALLEGRA PO), Norgestimate-Ethinyl Estradiol Triphasic, ferrous sulfate, lisinopril-hydrochlorothiazide, and metoprolol succinate.  Meds ordered this encounter  Medications  . potassium chloride (K-DUR) 10 MEQ tablet    Sig: Take 1 tablet (10 mEq total) by mouth daily.    Dispense:  90 tablet    Refill:  1    Order Specific Question:   Supervising Provider    Answer:   Tresa Garter [[id number]]  . metoprolol succinate (TOPROL-XL) 50 MG 24 hr tablet    Sig: Take 1 tablet (50 mg total) by mouth at bedtime. Take with or immediately following a meal.    Dispense:  90 tablet    Refill:  1    Order Specific Question:   Supervising Provider    Answer:   Tresa Garter [[id number]]    Follow-up: Return in about 6 weeks (around 06/30/2017) for CPE (fasting).  Alysia Penna, NP

## 2017-05-18 NOTE — Patient Instructions (Signed)
Please go to basement for blood draw.  You will be contacted to schedule appt for echocardiogram.  Bring BP machine to next OV.  Continue current medications, BP check at home, and low sodium diet.

## 2017-05-18 NOTE — Assessment & Plan Note (Addendum)
Uncontrolled  possibly White coat? She reports BP readings of 120s/70s on home machine. Persistent tachycardia. Maintain metoprolol and lisinopril/HCTZ. Ordered plasma aldosterone/renin ratio, catecholamine and metanephrine. Ordered echocardiogram. Order sleep study. Consider adding amlodipine vs spironolactone vs increase lisinopril dose?

## 2017-05-25 ENCOUNTER — Other Ambulatory Visit: Payer: Self-pay

## 2017-05-25 ENCOUNTER — Ambulatory Visit (HOSPITAL_COMMUNITY): Payer: BLUE CROSS/BLUE SHIELD | Attending: Cardiology

## 2017-05-25 DIAGNOSIS — I10 Essential (primary) hypertension: Secondary | ICD-10-CM | POA: Diagnosis not present

## 2017-05-25 DIAGNOSIS — R000 Tachycardia, unspecified: Secondary | ICD-10-CM | POA: Insufficient documentation

## 2017-05-25 LAB — METANEPHRINES, PLASMA
Metanephrine, Free: <25 pg/mL (ref <=57)
Normetanephrine, Free: 29 pg/mL (ref <=148)
Total Metanephrines-Plasma: 29 pg/mL (ref <=205)

## 2017-05-25 LAB — ALDOSTERONE + RENIN ACTIVITY W/ RATIO
ALDO / PRA RATIO: 7.1 ratio (ref 0.9–28.9)
ALDOSTERONE, SERUM: 7 ng/dL
RENIN ACTIVITY: 0.99 ng/mL/h (ref 0.25–5.82)

## 2017-05-25 LAB — CATECHOLAMINES, FRACTIONATED, PLASMA
CATECHOLAMINES, TOTAL: 266 pg/mL
Norepinephrine: 266 pg/mL

## 2017-05-26 ENCOUNTER — Telehealth: Payer: Self-pay | Admitting: Nurse Practitioner

## 2017-05-26 NOTE — Telephone Encounter (Signed)
Patient called back about results. She has been informed, she understood.

## 2017-07-13 ENCOUNTER — Encounter: Payer: BLUE CROSS/BLUE SHIELD | Admitting: Nurse Practitioner

## 2017-08-10 ENCOUNTER — Encounter: Payer: BLUE CROSS/BLUE SHIELD | Admitting: Nurse Practitioner

## 2017-08-17 ENCOUNTER — Encounter: Payer: BLUE CROSS/BLUE SHIELD | Admitting: Nurse Practitioner

## 2017-09-07 ENCOUNTER — Encounter: Payer: BLUE CROSS/BLUE SHIELD | Admitting: Nurse Practitioner

## 2017-09-21 ENCOUNTER — Encounter: Payer: BLUE CROSS/BLUE SHIELD | Admitting: Nurse Practitioner

## 2017-09-28 ENCOUNTER — Encounter: Payer: BLUE CROSS/BLUE SHIELD | Admitting: Nurse Practitioner

## 2017-10-12 ENCOUNTER — Encounter: Payer: BLUE CROSS/BLUE SHIELD | Admitting: Nurse Practitioner

## 2017-10-20 ENCOUNTER — Encounter: Payer: BLUE CROSS/BLUE SHIELD | Admitting: Nurse Practitioner

## 2017-10-23 ENCOUNTER — Other Ambulatory Visit: Payer: Self-pay

## 2017-10-23 ENCOUNTER — Telehealth: Payer: Self-pay | Admitting: Nurse Practitioner

## 2017-10-23 DIAGNOSIS — I10 Essential (primary) hypertension: Secondary | ICD-10-CM

## 2017-10-23 MED ORDER — LISINOPRIL-HYDROCHLOROTHIAZIDE 20-12.5 MG PO TABS: 1.0000 | ORAL_TABLET | Freq: Every day | ORAL | 0 refills | Status: DC

## 2017-10-23 NOTE — Telephone Encounter (Signed)
Refill sent.

## 2017-10-23 NOTE — Telephone Encounter (Signed)
Copied from CRM [phone]#[id number]. Topic: Quick Communication - Rx Refill/Question >> Oct 23, 2017 11:07 AM Rudi CocoLathan, [name] M, NT wrote: Medication:lisinopril-hydrochlorothiazide Marcell Anger(PRINZIDE,ZESTORETIC) 20-12.5 MG tablet [[id number]][[id number]]  Has the patient contacted their pharmacy? yes (Agent: If no, request that the patient contact the pharmacy for the refill.) Preferred Pharmacy (with phone number or street name): [id number] - [place], [place] - [address] [facility] [address] [place] [id number] Phone: [phone] Fax: [phone]   Agent: Please be advised that RX refills may take up to 3 business days. We ask that you follow-up with your pharmacy.

## 2017-10-23 NOTE — Telephone Encounter (Signed)
Prinzide 20-12.5 mg tablet refill request  [facility]     [facility] [address] [place] [address]

## 2017-11-01 ENCOUNTER — Ambulatory Visit (INDEPENDENT_AMBULATORY_CARE_PROVIDER_SITE_OTHER): Payer: BLUE CROSS/BLUE SHIELD | Admitting: Nurse Practitioner

## 2017-11-01 ENCOUNTER — Encounter: Payer: Self-pay | Admitting: Nurse Practitioner

## 2017-11-01 VITALS — BP 154/84 | HR 95 | Temp 98.1°F | Ht 64.0 in | Wt 179.2 lb

## 2017-11-01 DIAGNOSIS — Z0001 Encounter for general adult medical examination with abnormal findings: Secondary | ICD-10-CM

## 2017-11-01 DIAGNOSIS — L680 Hirsutism: Secondary | ICD-10-CM | POA: Diagnosis not present

## 2017-11-01 DIAGNOSIS — Z13220 Encounter for screening for lipoid disorders: Secondary | ICD-10-CM

## 2017-11-01 DIAGNOSIS — Z3041 Encounter for surveillance of contraceptive pills: Secondary | ICD-10-CM

## 2017-11-01 DIAGNOSIS — Z136 Encounter for screening for cardiovascular disorders: Secondary | ICD-10-CM | POA: Diagnosis not present

## 2017-11-01 DIAGNOSIS — D649 Anemia, unspecified: Secondary | ICD-10-CM

## 2017-11-01 DIAGNOSIS — I10 Essential (primary) hypertension: Secondary | ICD-10-CM

## 2017-11-01 LAB — CBC
HEMATOCRIT: 39.7 % (ref 36.0–46.0)
Hemoglobin: 13.8 g/dL (ref 12.0–15.0)
MCHC: 34.7 g/dL (ref 30.0–36.0)
MCV: 88.1 fl (ref 78.0–100.0)
PLATELETS: 232 10*3/uL (ref 150.0–400.0)
RBC: 4.51 Mil/uL (ref 3.87–5.11)
RDW: 12 % (ref 11.5–15.5)
WBC: 5 10*3/uL (ref 4.0–10.5)

## 2017-11-01 LAB — COMPREHENSIVE METABOLIC PANEL
ALT: 19 U/L (ref 0–35)
AST: 17 U/L (ref 0–37)
Albumin: 4 g/dL (ref 3.5–5.2)
Alkaline Phosphatase: 38 U/L — ABNORMAL LOW (ref 39–117)
BILIRUBIN TOTAL: 0.6 mg/dL (ref 0.2–1.2)
BUN: 8 mg/dL (ref 6–23)
CALCIUM: 9.3 mg/dL (ref 8.4–10.5)
CO2: 28 meq/L (ref 19–32)
CREATININE: 0.72 mg/dL (ref 0.40–1.20)
Chloride: 100 mEq/L (ref 96–112)
GFR: 116.52 mL/min (ref >60.00)
Glucose, Bld: 79 mg/dL (ref 70–99)
Potassium: 3.9 mEq/L (ref 3.5–5.1)
SODIUM: 136 meq/L (ref 135–145)
Total Protein: 7.1 g/dL (ref 6.0–8.3)

## 2017-11-01 LAB — LIPID PANEL
CHOL/HDL RATIO: 2
Cholesterol: 156 mg/dL (ref 0–200)
HDL: 67.8 mg/dL (ref >39.00)
LDL Cholesterol: 77 mg/dL (ref 0–99)
NONHDL: 88.39
TRIGLYCERIDES: 55 mg/dL (ref 0.0–149.0)
VLDL: 11 mg/dL (ref 0.0–40.0)

## 2017-11-01 LAB — TSH: TSH: 0.7 u[IU]/mL (ref 0.35–4.50)

## 2017-11-01 MED ORDER — FERROUS SULFATE 325 (65 FE) MG PO TABS: 325.0000 mg | ORAL_TABLET | Freq: Every day | ORAL | 3 refills | Status: AC

## 2017-11-01 MED ORDER — NORGESTIM-ETH ESTRAD TRIPHASIC 0.18/0.215/0.25 MG-25 MCG PO TABS: 1.0000 | ORAL_TABLET | Freq: Every day | ORAL | 3 refills | Status: DC

## 2017-11-01 NOTE — Patient Instructions (Addendum)
Need to schedule annual eye exam and dental exam every 62month.  Continue to maintain DASH diet.  Start exercise (walking 30-433ms daily).  Stable lab results. I will not be change BP medication at this time: maintain lisinopril/HCTZ.  For metoprolol, start half tab once a day, since it has not made difference in HR or BP since initiated 46m39monthgo. Need to make follow up in 2weeks. She needs to bring home BP machine to this appt  Health Maintenance, Female Adopting a healthy lifestyle and getting preventive care can go a long way to promote health and wellness. Talk with your health care provider about what schedule of regular examinations is right for you. This is a good chance for you to check in with your provider about disease prevention and staying healthy. In between checkups, there are plenty of things you can do on your own. Experts have done a lot of research about which lifestyle changes and preventive measures are most likely to keep you healthy. Ask your health care provider for more information. Weight and diet Eat a healthy diet  Be sure to include plenty of vegetables, fruits, low-fat dairy products, and lean protein.  Do not eat a lot of foods high in solid fats, added sugars, or salt.  Get regular exercise. This is one of the most important things you can do for your health. ? Most adults should exercise for at least 150 minutes each week. The exercise should increase your heart rate and make you sweat (moderate-intensity exercise). ? Most adults should also do strengthening exercises at least twice a week. This is in addition to the moderate-intensity exercise.  Maintain a healthy weight  Body mass index (BMI) is a measurement that can be used to identify possible weight problems. It estimates body fat based on height and weight. Your health care provider can help determine your BMI and help you achieve or maintain a healthy weight.  For females 20 51ars of age and  older: ? A BMI below 18.5 is considered underweight. ? A BMI of 18.5 to 24.9 is normal. ? A BMI of 25 to 29.9 is considered overweight. ? A BMI of 30 and above is considered obese.  Watch levels of cholesterol and blood lipids  You should start having your blood tested for lipids and cholesterol at 20 77ars of age, then have this test every 5 years.  You may need to have your cholesterol levels checked more often if: ? Your lipid or cholesterol levels are high. ? You are older than 50 1ars of age. ? You are at high risk for heart disease.  Cancer screening Lung Cancer  Lung cancer screening is recommended for adults 55-1 60ars old who are at high risk for lung cancer because of a history of smoking.  A yearly low-dose CT scan of the lungs is recommended for people who: ? Currently smoke. ? Have quit within the past 15 years. ? Have at least a 30-pack-year history of smoking. A pack year is smoking an average of one pack of cigarettes a day for 1 year.  Yearly screening should continue until it has been 15 years since you quit.  Yearly screening should stop if you develop a health problem that would prevent you from having lung cancer treatment.  Breast Cancer  Practice breast self-awareness. This means understanding how your breasts normally appear and feel.  It also means doing regular breast self-exams. Let your health care provider know about any changes, no matter how  small.  If you are in your 20s or 30s, you should have a clinical breast exam (CBE) by a health care provider every 1-3 years as part of a regular health exam.  If you are 40 or older, have a CBE every year. Also consider having a breast X-ray (mammogram) every year.  If you have a family history of breast cancer, talk to your health care provider about genetic screening.  If you are at high risk for breast cancer, talk to your health care provider about having an MRI and a mammogram every year.  Breast  cancer gene (BRCA) assessment is recommended for women who have family members with BRCA-related cancers. BRCA-related cancers include: ? Breast. ? Ovarian. ? Tubal. ? Peritoneal cancers.  Results of the assessment will determine the need for genetic counseling and BRCA1 and BRCA2 testing.  Cervical Cancer Your health care provider may recommend that you be screened regularly for cancer of the pelvic organs (ovaries, uterus, and vagina). This screening involves a pelvic examination, including checking for microscopic changes to the surface of your cervix (Pap test). You may be encouraged to have this screening done every 3 years, beginning at age 72.  For women ages 23-65, health care providers may recommend pelvic exams and Pap testing every 3 years, or they may recommend the Pap and pelvic exam, combined with testing for human papilloma virus (HPV), every 5 years. Some types of HPV increase your risk of cervical cancer. Testing for HPV may also be done on women of any age with unclear Pap test results.  Other health care providers may not recommend any screening for nonpregnant women who are considered low risk for pelvic cancer and who do not have symptoms. Ask your health care provider if a screening pelvic exam is right for you.  If you have had past treatment for cervical cancer or a condition that could lead to cancer, you need Pap tests and screening for cancer for at least 20 years after your treatment. If Pap tests have been discontinued, your risk factors (such as having a new sexual partner) need to be reassessed to determine if screening should resume. Some women have medical problems that increase the chance of getting cervical cancer. In these cases, your health care provider may recommend more frequent screening and Pap tests.  Colorectal Cancer  This type of cancer can be detected and often prevented.  Routine colorectal cancer screening usually begins at 38 years of age and  continues through 38 years of age.  Your health care provider may recommend screening at an earlier age if you have risk factors for colon cancer.  Your health care provider may also recommend using home test kits to check for hidden blood in the stool.  A small camera at the end of a tube can be used to examine your colon directly (sigmoidoscopy or colonoscopy). This is done to check for the earliest forms of colorectal cancer.  Routine screening usually begins at age 41.  Direct examination of the colon should be repeated every 5-10 years through 38 years of age. However, you may need to be screened more often if early forms of precancerous polyps or small growths are found.  Skin Cancer  Check your skin from head to toe regularly.  Tell your health care provider about any new moles or changes in moles, especially if there is a change in a mole's shape or color.  Also tell your health care provider if you have a mole  that is larger than the size of a pencil eraser.  Always use sunscreen. Apply sunscreen liberally and repeatedly throughout the day.  Protect yourself by wearing long sleeves, pants, a wide-brimmed hat, and sunglasses whenever you are outside.  Heart disease, diabetes, and high blood pressure  High blood pressure causes heart disease and increases the risk of stroke. High blood pressure is more likely to develop in: ? People who have blood pressure in the high end of the normal range (130-139/85-89 mm Hg). ? People who are overweight or obese. ? People who are African American.  If you are 31-36 years of age, have your blood pressure checked every 3-5 years. If you are 4 years of age or older, have your blood pressure checked every year. You should have your blood pressure measured twice-once when you are at a hospital or clinic, and once when you are not at a hospital or clinic. Record the average of the two measurements. To check your blood pressure when you are not  at a hospital or clinic, you can use: ? An automated blood pressure machine at a pharmacy. ? A home blood pressure monitor.  If you are between 63 years and 77 years old, ask your health care provider if you should take aspirin to prevent strokes.  Have regular diabetes screenings. This involves taking a blood sample to check your fasting blood sugar level. ? If you are at a normal weight and have a low risk for diabetes, have this test once every three years after 38 years of age. ? If you are overweight and have a high risk for diabetes, consider being tested at a younger age or more often. Preventing infection Hepatitis B  If you have a higher risk for hepatitis B, you should be screened for this virus. You are considered at high risk for hepatitis B if: ? You were born in a country where hepatitis B is common. Ask your health care provider which countries are considered high risk. ? Your parents were born in a high-risk country, and you have not been immunized against hepatitis B (hepatitis B vaccine). ? You have HIV or AIDS. ? You use needles to inject street drugs. ? You live with someone who has hepatitis B. ? You have had sex with someone who has hepatitis B. ? You get hemodialysis treatment. ? You take certain medicines for conditions, including cancer, organ transplantation, and autoimmune conditions.  Hepatitis C  Blood testing is recommended for: ? Everyone born from [birth year] through 1966. ? Anyone with known risk factors for hepatitis C.  Sexually transmitted infections (STIs)  You should be screened for sexually transmitted infections (STIs) including gonorrhea and chlamydia if: ? You are sexually active and are younger than 38 years of age. ? You are older than 38 years of age and your health care provider tells you that you are at risk for this type of infection. ? Your sexual activity has changed since you were last screened and you are at an increased risk for chlamydia  or gonorrhea. Ask your health care provider if you are at risk.  If you do not have HIV, but are at risk, it may be recommended that you take a prescription medicine daily to prevent HIV infection. This is called pre-exposure prophylaxis (PrEP). You are considered at risk if: ? You are sexually active and do not regularly use condoms or know the HIV status of your partner(s). ? You take drugs by injection. ? You are  sexually active with a partner who has HIV.  Talk with your health care provider about whether you are at high risk of being infected with HIV. If you choose to begin PrEP, you should first be tested for HIV. You should then be tested every 3 months for as long as you are taking PrEP. Pregnancy  If you are premenopausal and you may become pregnant, ask your health care provider about preconception counseling.  If you may become pregnant, take 400 to 800 micrograms (mcg) of folic acid every day.  If you want to prevent pregnancy, talk to your health care provider about birth control (contraception). Osteoporosis and menopause  Osteoporosis is a disease in which the bones lose minerals and strength with aging. This can result in serious bone fractures. Your risk for osteoporosis can be identified using a bone density scan.  If you are 57 years of age or older, or if you are at risk for osteoporosis and fractures, ask your health care provider if you should be screened.  Ask your health care provider whether you should take a calcium or vitamin D supplement to lower your risk for osteoporosis.  Menopause may have certain physical symptoms and risks.  Hormone replacement therapy may reduce some of these symptoms and risks. Talk to your health care provider about whether hormone replacement therapy is right for you. Follow these instructions at home:  Schedule regular health, dental, and eye exams.  Stay current with your immunizations.  Do not use any tobacco products  including cigarettes, chewing tobacco, or electronic cigarettes.  If you are pregnant, do not drink alcohol.  If you are breastfeeding, limit how much and how often you drink alcohol.  Limit alcohol intake to no more than 1 drink per day for nonpregnant women. One drink equals 12 ounces of beer, 5 ounces of wine, or 1 ounces of hard liquor.  Do not use street drugs.  Do not share needles.  Ask your health care provider for help if you need support or information about quitting drugs.  Tell your health care provider if you often feel depressed.  Tell your health care provider if you have ever been abused or do not feel safe at home. This information is not intended to replace advice given to you by your health care provider. Make sure you discuss any questions you have with your health care provider. Document Released: 01/17/2011 Document Revised: 12/10/2015 Document Reviewed: 04/07/2015 Elsevier Interactive Patient Education  [employer].

## 2017-11-01 NOTE — Progress Notes (Signed)
Subjective:    Patient ID: [name], female    DOB: Mar 30, 1981, 37 y.o.   MRN: [id number]  Patient presents today for complete physical   HPI  HTN: Reports home BP 120s/80s Current use of lisinopril/HCTZ and metoprolol. BP Readings from Last 3 Encounters:  11/01/17 (!) 154/84  05/18/17 (!) 162/80  04/26/17 (!) 160/88   Facial Hair: Chronic, onset ? Sisters with chest and facial hair.  Anemia: Use of ferrous sulfate once a day with no adverse effects. Denies any GI bleed. Regular menstrual cycle, no clots, no cramps, no tissue.   Immunizations: (TDAP, Hep C screen, Pneumovax, Influenza, zoster)  Health Maintenance  Topic Date Due  . Flu Shot  02/15/2018  . Pap Smear  07/01/2019  . Tetanus Vaccine  04/17/2022  . HIV Screening  Completed   Diet:dash diet.  Weight:  Wt Readings from Last 3 Encounters:  11/01/17 179 lb 3.2 oz (81.3 kg)  05/18/17 183 lb (83 kg)  04/26/17 184 lb (83.5 kg)   Exercise:walking.  Fall Risk: Fall Risk  11/01/2017 04/26/2017  Falls in the past year? No No   Home Safety:home with daughter.  Depression/Suicide: Depression screen [facility] 2/9 11/01/2017 04/26/2017 01/21/2016  Decreased Interest 0 0 0  Down, Depressed, Hopeless 0 0 0  PHQ - 2 Score 0 0 0  Altered sleeping - - 0  Tired, decreased energy - - 0  Change in appetite - - 0  Feeling bad or failure about yourself  - - 0  Trouble concentrating - - 0  Moving slowly or fidgety/restless - - 0  Suicidal thoughts - - 0  PHQ-9 Score - - 0   Pap Smear (every 71yrs for >21-29 without HPV, every 59yrs for >30-9yrs with HPV):  Vision: needed, will schedule.  Dental:needed, will schedule.  Advanced Directive: Advanced Directives 01/21/2016  Does Patient Have a Medical Advance Directive? No  Would patient like information on creating a medical advance directive? -  Pre-existing out of facility DNR order (yellow form or pink MOST form) -   Sexual History (birth control, marital  status, STD):single, sexually active  Medications and allergies reviewed with patient and updated if appropriate.  Patient Active Problem List   Diagnosis Date Noted  . Hirsutism 11/01/2017  . Tachycardia 05/18/2017  . Essential hypertension, benign 03/24/2016  . Encounter for surveillance of contraceptive pills 03/24/2016    Current Outpatient Medications on File Prior to Visit  Medication Sig Dispense Refill  . cetirizine (ZYRTEC) 10 MG tablet Take 10 mg by mouth daily as needed for allergies. Reported on 01/21/2016    . diphenhydrAMINE (BENADRYL) 50 MG capsule Take 50 mg by mouth every 6 (six) hours as needed for allergies. Reported on 01/21/2016    . Fexofenadine HCl (ALLEGRA PO) Take by mouth.    Marland Kitchen ibuprofen (ADVIL,MOTRIN) 600 MG tablet Take 1 tablet (600 mg total) by mouth every 6 (six) hours as needed for pain. 90 tablet 0   No current facility-administered medications on file prior to visit.     Past Medical History:  Diagnosis Date  . Hypertension     Past Surgical History:  Procedure Laterality Date  . CESAREAN SECTION  06/05/2012   Procedure: CESAREAN SECTION;  Surgeon: Levi Aland, MD;  Location: [facility];  Service: Obstetrics;  Laterality: N/A;  . NO PAST SURGERIES      Social History   Socioeconomic History  . Marital status: Single    Spouse name: Not on file  .  Number of children: 1  . Years of education: Not on file  . Highest education level: Not on file  Occupational History  . Not on file  Social Needs  . Financial resource strain: Not on file  . Food insecurity:    Worry: Not on file    Inability: Not on file  . Transportation needs:    Medical: Not on file    Non-medical: Not on file  Tobacco Use  . Smoking status: Never Smoker  . Smokeless tobacco: Never Used  Substance and Sexual Activity  . Alcohol use: No  . Drug use: No  . Sexual activity: Yes    Birth control/protection: Condom    Comment: not since she found out she was pregnant    Lifestyle  . Physical activity:    Days per week: Not on file    Minutes per session: Not on file  . Stress: Not on file  Relationships  . Social connections:    Talks on phone: Not on file    Gets together: Not on file    Attends religious service: Not on file    Active member of club or organization: Not on file    Attends meetings of clubs or organizations: Not on file    Relationship status: Not on file  Other Topics Concern  . Not on file  Social History Narrative  . Not on file    Family History  Problem Relation Age of Onset  . Hypertension Maternal Grandmother   . Hypertension Maternal Grandfather   . Hypertension Paternal Grandmother   . Hypertension Paternal Grandfather   . High blood pressure Mother   . High blood pressure Father   . Anemia Sister   . Crohn's disease Sister   . Diabetes Maternal Aunt   . Crohn's disease Maternal Aunt   . Diabetes Maternal Uncle         Review of Systems  Constitutional: Negative for fever, malaise/fatigue and weight loss.  HENT: Negative for congestion and sore throat.   Eyes:       Negative for visual changes  Respiratory: Negative for cough and shortness of breath.   Cardiovascular: Negative for chest pain, palpitations and leg swelling.  Gastrointestinal: Negative for blood in stool, constipation, diarrhea and heartburn.  Genitourinary: Negative for dysuria, frequency and urgency.  Musculoskeletal: Negative for falls, joint pain and myalgias.  Skin: Negative for rash.  Neurological: Negative for dizziness, sensory change and headaches.  Endo/Heme/Allergies: Does not bruise/bleed easily.  Psychiatric/Behavioral: Negative for depression, substance abuse and suicidal ideas. The patient is not nervous/anxious and does not have insomnia.     Objective:   Vitals:   11/01/17 1305  BP: (!) 154/84  Pulse: 95  Temp: 98.1 F (36.7 C)  SpO2: 99%    Body mass index is 30.76 kg/m.   Physical  Examination:  Physical Exam  Constitutional: She is oriented to person, place, and time. No distress.  HENT:  Right Ear: External ear normal.  Left Ear: External ear normal.  Nose: Nose normal.  Mouth/Throat: No oropharyngeal exudate.  Eyes: No scleral icterus.  Neck: Normal range of motion. Neck supple.  Cardiovascular: Normal rate, regular rhythm and normal heart sounds.  Pulmonary/Chest: Effort normal and breath sounds normal. No respiratory distress.  Abdominal: Soft. She exhibits no distension.  Genitourinary:  Genitourinary Comments: Declined pelvic and breast exam  Musculoskeletal: Normal range of motion. She exhibits no edema.  Lymphadenopathy:    She has no cervical adenopathy.  Neurological: She is alert and oriented to person, place, and time.  Skin: Skin is warm and dry.  Psychiatric: She has a normal mood and affect. Her behavior is normal.    ASSESSMENT and PLAN:  Natalina was seen today for annual exam.  Diagnoses and all orders for this visit:  Encounter for preventative adult health care exam with abnormal findings -     CBC -     Lipid panel -     Comprehensive metabolic panel -     TSH  Essential hypertension, benign -     metoprolol succinate (TOPROL-XL) 25 MG 24 hr tablet; Take 1 tablet (25 mg total) by mouth at bedtime. Weaning off medication -     lisinopril-hydrochlorothiazide (PRINZIDE,ZESTORETIC) 20-12.5 MG tablet; Take 1 tablet by mouth daily.  Encounter for surveillance of contraceptive pills -     Norgestimate-Ethinyl Estradiol Triphasic (TRI-LO-SPRINTEC) 0.18/0.215/0.25 MG-25 MCG tab; Take 1 tablet by mouth daily.  Hirsutism  Encounter for lipid screening for cardiovascular disease -     Lipid panel  Anemia, unspecified type -     ferrous sulfate 325 (65 FE) MG tablet; Take 1 tablet (325 mg total) by mouth daily after supper.   Essential hypertension, benign She canceled sleep study. Home readings (120s-130s/70s-80s) indicate BP at  goal with lisinopril/HCTZ and metoprolol. Reading in office are persistently >150s/80s.  Possible white coat syndrome??  Stable lab results. I will not be change BP medication at this time: maintain lisinopril/HCTZ.  For metoprolol, start half tab once a day, since it has not made difference in HR or BP since initiated 6months ago. Need to make follow up in 2weeks. She needs to bring home BP machine to this appt     Follow up: Return in about 2 weeks (around 11/15/2017) for HTN (re eval with home BP machine).  Alysia Penna, NP

## 2017-11-01 NOTE — Assessment & Plan Note (Addendum)
She canceled sleep study. Home readings (120s-130s/70s-80s) indicate BP at goal with lisinopril/HCTZ and metoprolol. Reading in office are persistently >150s/80s.  Possible white coat syndrome??  Stable lab results. I will not be change BP medication at this time: maintain lisinopril/HCTZ.  For metoprolol, start half tab once a day, since it has not made difference in HR or BP since initiated 6months ago. Need to make follow up in 2weeks. She needs to bring home BP machine to this appt

## 2017-11-02 ENCOUNTER — Encounter: Payer: Self-pay | Admitting: Nurse Practitioner

## 2017-11-02 DIAGNOSIS — D649 Anemia, unspecified: Secondary | ICD-10-CM

## 2017-11-02 HISTORY — DX: Anemia, unspecified: D64.9

## 2017-11-02 MED ORDER — METOPROLOL SUCCINATE ER 25 MG PO TB24: 25.0000 mg | ORAL_TABLET | Freq: Every day | ORAL | Status: DC

## 2017-11-02 MED ORDER — LISINOPRIL-HYDROCHLOROTHIAZIDE 20-12.5 MG PO TABS: 1.0000 | ORAL_TABLET | Freq: Every day | ORAL | 3 refills | Status: DC

## 2017-12-18 ENCOUNTER — Telehealth: Payer: Self-pay | Admitting: Nurse Practitioner

## 2017-12-18 NOTE — Telephone Encounter (Signed)
Copied from CRM [phone]. Topic: Quick Communication - See Telephone Encounter >> Dec 18, 2017 11:37 AM Mare LoanBurton, Donna F wrote: Pt is needing refills on potassium and norgastimate  [facility] [address] [phone]  Best number [phone]

## 2017-12-19 NOTE — Telephone Encounter (Signed)
Patient should have refills on birth control-  Can not find potassium on list. Left message for patient to call back for verification of medication needed.

## 2017-12-20 NOTE — Telephone Encounter (Signed)
[facility]

## 2017-12-20 NOTE — Telephone Encounter (Signed)
Left vm for the to call back. K+ was d/c on 11/02/17 by Nche. Pt should have birth control refill last her a year.

## 2017-12-21 NOTE — Telephone Encounter (Signed)
Pt is aware.  

## 2018-01-30 ENCOUNTER — Telehealth: Payer: Self-pay | Admitting: Nurse Practitioner

## 2018-01-30 NOTE — Telephone Encounter (Signed)
Need OV

## 2018-01-30 NOTE — Telephone Encounter (Signed)
Incoming call from patient requesting refill for Toprol XL 25 mg  LOV 11/01/17 with Nche , Charolette NP  Last refill:  11/02/17  [id number]

## 2018-01-30 NOTE — Telephone Encounter (Signed)
Copied from CRM [phone]. Topic: Quick Communication - Rx Refill/Question >> Jan 30, 2018  9:38 AM Laural BenesJohnson, Louisianahiquita C wrote: Medication: metoprolol succinate (TOPROL-XL) 25 MG 24 hr tablet  Has the patient contacted their pharmacy? No  (Agent: If no, request that the patient contact the pharmacy for the refill.) (Agent: If yes, when and what did the pharmacy advise?)  Preferred Pharmacy (with phone number or street name): [id number] [place], [place] - [address]  Agent: Please be advised that RX refills may take up to 3 business days. We ask that you follow-up with your pharmacy.

## 2018-01-30 NOTE — Telephone Encounter (Signed)
Katrina GowerCharlotte please advise, ok to send in this med?   Last ov on 04/19 you are trying to wean the pt off of this med. Last lab you advise the pt to take 1/2 tab daily along with the lisinopril.

## 2018-01-31 NOTE — Telephone Encounter (Signed)
appt made

## 2018-01-31 NOTE — Telephone Encounter (Signed)
Tired to call the pt, unable to leave vm.

## 2018-02-08 ENCOUNTER — Ambulatory Visit: Payer: BLUE CROSS/BLUE SHIELD | Admitting: Nurse Practitioner

## 2018-02-14 ENCOUNTER — Ambulatory Visit: Payer: BLUE CROSS/BLUE SHIELD | Admitting: Nurse Practitioner

## 2018-02-21 ENCOUNTER — Ambulatory Visit: Payer: BLUE CROSS/BLUE SHIELD | Admitting: Nurse Practitioner

## 2018-02-21 ENCOUNTER — Encounter: Payer: Self-pay | Admitting: Nurse Practitioner

## 2018-02-21 VITALS — BP 156/84 | HR 103 | Temp 98.3°F | Ht 64.0 in | Wt 187.0 lb

## 2018-02-21 DIAGNOSIS — I10 Essential (primary) hypertension: Secondary | ICD-10-CM

## 2018-02-21 DIAGNOSIS — Z3041 Encounter for surveillance of contraceptive pills: Secondary | ICD-10-CM

## 2018-02-21 MED ORDER — METOPROLOL SUCCINATE ER 25 MG PO TB24: 25.0000 mg | ORAL_TABLET | Freq: Every day | ORAL | 3 refills | Status: DC

## 2018-02-21 MED ORDER — NORGESTIM-ETH ESTRAD TRIPHASIC 0.18/0.215/0.25 MG-25 MCG PO TABS: 1.0000 | ORAL_TABLET | Freq: Every day | ORAL | 3 refills | Status: DC

## 2018-02-21 NOTE — Progress Notes (Signed)
Subjective:  Patient ID: [name], female    DOB: Mar 16, 1981  Age: 37 y.o. MRN: [id number]  CC: Follow-up (follow up on BP. last check on 02/19/18  123/80, she forgot her BP machine. )  HPI  HTN: Current use of lisinopril/HCTZ, out of metoprolol. Home readings:BP 120s/80s. HR 70s Forgot BP machine today. Maintains DASH diet. BP Readings from Last 3 Encounters:  02/21/18 (!) 156/84  11/01/17 (!) 154/84  05/18/17 (!) 162/80   Reviewed past Medical, Social and Family history today.  Outpatient Medications Prior to Visit  Medication Sig Dispense Refill  . cetirizine (ZYRTEC) 10 MG tablet Take 10 mg by mouth daily as needed for allergies. Reported on 01/21/2016    . diphenhydrAMINE (BENADRYL) 50 MG capsule Take 50 mg by mouth every 6 (six) hours as needed for allergies. Reported on 01/21/2016    . ferrous sulfate 325 (65 FE) MG tablet Take 1 tablet (325 mg total) by mouth daily after supper.  3  . Fexofenadine HCl (ALLEGRA PO) Take by mouth.    Marland Kitchen ibuprofen (ADVIL,MOTRIN) 600 MG tablet Take 1 tablet (600 mg total) by mouth every 6 (six) hours as needed for pain. 90 tablet 0  . lisinopril-hydrochlorothiazide (PRINZIDE,ZESTORETIC) 20-12.5 MG tablet Take 1 tablet by mouth daily. 90 tablet 3  . Norgestimate-Ethinyl Estradiol Triphasic (TRI-LO-SPRINTEC) 0.18/0.215/0.25 MG-25 MCG tab Take 1 tablet by mouth daily. 3 Package 3  . metoprolol succinate (TOPROL-XL) 25 MG 24 hr tablet Take 1 tablet (25 mg total) by mouth at bedtime. Weaning off medication     No facility-administered medications prior to visit.     ROS Review of Systems  Constitutional: Negative for malaise/fatigue and weight loss.  Respiratory: Negative for cough and shortness of breath.   Cardiovascular: Negative for chest pain, palpitations and leg swelling.  Neurological: Negative for headaches.     Objective:  BP (!) 156/84   Pulse (!) 103   Temp 98.3 F (36.8 C) (Oral)   Ht 5\' 4"  (1.626 m)   Wt 187 lb (84.8 kg)    SpO2 99%   BMI 32.10 kg/m   BP Readings from Last 3 Encounters:  02/21/18 (!) 156/84  11/01/17 (!) 154/84  05/18/17 (!) 162/80    Wt Readings from Last 3 Encounters:  02/21/18 187 lb (84.8 kg)  11/01/17 179 lb 3.2 oz (81.3 kg)  05/18/17 183 lb (83 kg)    Physical Exam  Constitutional: She appears well-developed and well-nourished.  Neck: No JVD present.  Cardiovascular: Normal rate, regular rhythm and normal heart sounds.  Pulmonary/Chest: Effort normal.  Musculoskeletal: She exhibits no edema.  Psychiatric: She has a normal mood and affect. Her behavior is normal. Thought content normal.  Vitals reviewed.   Lab Results  Component Value Date   WBC 5.0 11/01/2017   HGB 13.8 11/01/2017   HCT 39.7 11/01/2017   PLT 232.0 11/01/2017   GLUCOSE 79 11/01/2017   CHOL 156 11/01/2017   TRIG 55.0 11/01/2017   HDL 67.80 11/01/2017   LDLCALC 77 11/01/2017   ALT 19 11/01/2017   AST 17 11/01/2017   NA 136 11/01/2017   K 3.9 11/01/2017   CL 100 11/01/2017   CREATININE 0.72 11/01/2017   BUN 8 11/01/2017   CO2 28 11/01/2017   TSH 0.70 11/01/2017    Assessment & Plan:   [name] was seen today for follow-up.  Diagnoses and all orders for this visit:  Essential hypertension, benign -     metoprolol succinate (TOPROL-XL) 25  MG 24 hr tablet; Take 1 tablet (25 mg total) by mouth at bedtime. Weaning off medication   I am having [name] maintain her ibuprofen, cetirizine, diphenhydrAMINE, Fexofenadine HCl (ALLEGRA PO), Norgestimate-Ethinyl Estradiol Triphasic, ferrous sulfate, lisinopril-hydrochlorothiazide, and metoprolol succinate.  Meds ordered this encounter  Medications  . metoprolol succinate (TOPROL-XL) 25 MG 24 hr tablet    Sig: Take 1 tablet (25 mg total) by mouth at bedtime. Weaning off medication    Dispense:  90 tablet    Refill:  3    Order Specific Question:   Supervising Provider    Answer:   Dianne DunARON, TALIA M [[id number]]    Follow-up: Return in about 6  months (around 08/24/2018) for HTN and anemia (fasting).  Katrina Pennaharlotte Huck Ashworth, NP

## 2018-02-21 NOTE — Patient Instructions (Addendum)
Continue to check BP and HR 2-3x/week.  Maintain DASH diet and regular exercise.   DASH Eating Plan DASH stands for "Dietary Approaches to Stop Hypertension." The DASH eating plan is a healthy eating plan that has been shown to reduce high blood pressure (hypertension). It may also reduce your risk for type 2 diabetes, heart disease, and stroke. The DASH eating plan may also help with weight loss. What are tips for following this plan? General guidelines  Avoid eating more than 2,300 mg (milligrams) of salt (sodium) a day. If you have hypertension, you may need to reduce your sodium intake to 1,500 mg a day.  Limit alcohol intake to no more than 1 drink a day for nonpregnant women and 2 drinks a day for men. One drink equals 12 oz of beer, 5 oz of wine, or 1 oz of hard liquor.  Work with your health care provider to maintain a healthy body weight or to lose weight. Ask what an ideal weight is for you.  Get at least 30 minutes of exercise that causes your heart to beat faster (aerobic exercise) most days of the week. Activities may include walking, swimming, or biking.  Work with your health care provider or diet and nutrition specialist (dietitian) to adjust your eating plan to your individual calorie needs. Reading food labels  Check food labels for the amount of sodium per serving. Choose foods with less than 5 percent of the Daily Value of sodium. Generally, foods with less than 300 mg of sodium per serving fit into this eating plan.  To find whole grains, look for the word "whole" as the first word in the ingredient list. Shopping  Buy products labeled as "low-sodium" or "no salt added."  Buy fresh foods. Avoid canned foods and premade or frozen meals. Cooking  Avoid adding salt when cooking. Use salt-free seasonings or herbs instead of table salt or sea salt. Check with your health care provider or pharmacist before using salt substitutes.  Do not fry foods. Cook foods using  healthy methods such as baking, boiling, grilling, and broiling instead.  Cook with heart-healthy oils, such as olive, canola, soybean, or sunflower oil. Meal planning   Eat a balanced diet that includes: ? 5 or more servings of fruits and vegetables each day. At each meal, try to fill half of your plate with fruits and vegetables. ? Up to 6-8 servings of whole grains each day. ? Less than 6 oz of lean meat, poultry, or fish each day. A 3-oz serving of meat is about the same size as a deck of cards. One egg equals 1 oz. ? 2 servings of low-fat dairy each day. ? A serving of nuts, seeds, or beans 5 times each week. ? Heart-healthy fats. Healthy fats called Omega-3 fatty acids are found in foods such as flaxseeds and coldwater fish, like sardines, salmon, and mackerel.  Limit how much you eat of the following: ? Canned or prepackaged foods. ? Food that is high in trans fat, such as fried foods. ? Food that is high in saturated fat, such as fatty meat. ? Sweets, desserts, sugary drinks, and other foods with added sugar. ? Full-fat dairy products.  Do not salt foods before eating.  Try to eat at least 2 vegetarian meals each week.  Eat more home-cooked food and less restaurant, buffet, and fast food.  When eating at a restaurant, ask that your food be prepared with less salt or no salt, if possible. What foods are  recommended? The items listed may not be a complete list. Talk with your dietitian about what dietary choices are best for you. Grains Whole-grain or whole-wheat bread. Whole-grain or whole-wheat pasta. Brown rice. [place]row. Bulgur. Whole-grain and low-sodium cereals. Pita bread. Low-fat, low-sodium crackers. Whole-wheat flour tortillas. Vegetables Fresh or frozen vegetables (raw, steamed, roasted, or grilled). Low-sodium or reduced-sodium tomato and vegetable juice. Low-sodium or reduced-sodium tomato sauce and tomato paste. Low-sodium or reduced-sodium canned  vegetables. Fruits All fresh, dried, or frozen fruit. Canned fruit in natural juice (without added sugar). Meat and other protein foods Skinless chicken or [place]. Ground chicken or [place]. Pork with fat trimmed off. Fish and seafood. Egg whites. Dried beans, peas, or lentils. Unsalted nuts, nut butters, and seeds. Unsalted canned beans. Lean cuts of beef with fat trimmed off. Low-sodium, lean deli meat. Dairy Low-fat (1%) or fat-free (skim) milk. Fat-free, low-fat, or reduced-fat cheeses. Nonfat, low-sodium ricotta or cottage cheese. Low-fat or nonfat yogurt. Low-fat, low-sodium cheese. Fats and oils Soft margarine without trans fats. Vegetable oil. Low-fat, reduced-fat, or light mayonnaise and salad dressings (reduced-sodium). Canola, safflower, olive, soybean, and sunflower oils. Avocado. Seasoning and other foods Herbs. Spices. Seasoning mixes without salt. Unsalted popcorn and pretzels. Fat-free sweets. What foods are not recommended? The items listed may not be a complete list. Talk with your dietitian about what dietary choices are best for you. Grains Baked goods made with fat, such as croissants, muffins, or some breads. Dry pasta or rice meal packs. Vegetables Creamed or fried vegetables. Vegetables in a cheese sauce. Regular canned vegetables (not low-sodium or reduced-sodium). Regular canned tomato sauce and paste (not low-sodium or reduced-sodium). Regular tomato and vegetable juice (not low-sodium or reduced-sodium). Angie Fava. Olives. Fruits Canned fruit in a light or heavy syrup. Fried fruit. Fruit in cream or butter sauce. Meat and other protein foods Fatty cuts of meat. Ribs. Fried meat. Berniece Salines. Sausage. Bologna and other processed lunch meats. Salami. Fatback. Hotdogs. Bratwurst. Salted nuts and seeds. Canned beans with added salt. Canned or smoked fish. Whole eggs or egg yolks. Chicken or [place] with skin. Dairy Whole or 2% milk, cream, and half-and-half. Whole or full-fat  cream cheese. Whole-fat or sweetened yogurt. Full-fat cheese. Nondairy creamers. Whipped toppings. Processed cheese and cheese spreads. Fats and oils Butter. Stick margarine. Lard. Shortening. Ghee. Bacon fat. Tropical oils, such as coconut, palm kernel, or palm oil. Seasoning and other foods Salted popcorn and pretzels. Onion salt, garlic salt, seasoned salt, table salt, and sea salt. Worcestershire sauce. Tartar sauce. Barbecue sauce. Teriyaki sauce. Soy sauce, including reduced-sodium. Steak sauce. Canned and packaged gravies. Fish sauce. Oyster sauce. Cocktail sauce. Horseradish that you find on the shelf. Ketchup. Mustard. Meat flavorings and tenderizers. Bouillon cubes. Hot sauce and Tabasco sauce. Premade or packaged marinades. Premade or packaged taco seasonings. Relishes. Regular salad dressings. Where to find more information:  National Heart, Lung, and [place]: https://wilson-eaton.com/  American Heart Association: www.heart.org Summary  The DASH eating plan is a healthy eating plan that has been shown to reduce high blood pressure (hypertension). It may also reduce your risk for type 2 diabetes, heart disease, and stroke.  With the DASH eating plan, you should limit salt (sodium) intake to 2,300 mg a day. If you have hypertension, you may need to reduce your sodium intake to 1,500 mg a day.  When on the DASH eating plan, aim to eat more fresh fruits and vegetables, whole grains, lean proteins, low-fat dairy, and heart-healthy fats.  Work with your health  care provider or diet and nutrition specialist (dietitian) to adjust your eating plan to your individual calorie needs. This information is not intended to replace advice given to you by your health care provider. Make sure you discuss any questions you have with your health care provider. Document Released: 06/23/2011 Document Revised: 06/27/2016 Document Reviewed: 06/27/2016 Elsevier Interactive Patient Education  [employer].

## 2018-08-22 ENCOUNTER — Ambulatory Visit: Payer: BLUE CROSS/BLUE SHIELD | Admitting: Nurse Practitioner

## 2018-08-22 DIAGNOSIS — Z0289 Encounter for other administrative examinations: Secondary | ICD-10-CM

## 2018-09-19 ENCOUNTER — Encounter: Payer: Self-pay | Admitting: Nurse Practitioner

## 2018-10-09 ENCOUNTER — Other Ambulatory Visit: Payer: Self-pay | Admitting: Nurse Practitioner

## 2018-10-09 DIAGNOSIS — I10 Essential (primary) hypertension: Secondary | ICD-10-CM

## 2018-10-09 NOTE — Telephone Encounter (Signed)
Please schedule webex virtual visit today or tomorrow for HTN eval  Thank you

## 2018-10-09 NOTE — Telephone Encounter (Signed)
Pt will call back, she needs to check with her insurance first. She will call; back tomorrow.

## 2018-10-12 NOTE — Telephone Encounter (Signed)
Left vm for the pt to call back.  

## 2018-10-15 ENCOUNTER — Other Ambulatory Visit: Payer: Self-pay | Admitting: Nurse Practitioner

## 2018-10-15 DIAGNOSIS — I10 Essential (primary) hypertension: Secondary | ICD-10-CM

## 2018-10-15 MED ORDER — LISINOPRIL-HYDROCHLOROTHIAZIDE 20-12.5 MG PO TABS: 1.0000 | ORAL_TABLET | Freq: Every day | ORAL | 0 refills | Status: DC

## 2018-10-15 NOTE — Telephone Encounter (Signed)
Only 30tabs can be sent in at this time. She should ask new provider if they can do video visit before she runs out of medication. IF not, she needs to schedule webex video visit with me. I will need her BP readings during the visit.

## 2018-10-15 NOTE — Telephone Encounter (Signed)
Pt called back and left a voicemail for Nurse to call back. Please advise

## 2018-10-15 NOTE — Telephone Encounter (Signed)
Spoke with the pt and her insurance will not pay for [facility] anymore, she has to see [facility] now. Pt stated she has 2 pills left and she was wondering if she can get [facility] to sent in some until she see the other doctor on 01/16/2019. Please advise.   FYI--pt aware that we send in 30 days supply today--she will pick it up.

## 2018-10-16 NOTE — Telephone Encounter (Signed)
Left vm for the pt to call back.  

## 2018-11-01 ENCOUNTER — Encounter: Payer: Self-pay | Admitting: Nurse Practitioner

## 2018-11-01 ENCOUNTER — Ambulatory Visit (INDEPENDENT_AMBULATORY_CARE_PROVIDER_SITE_OTHER): Payer: BLUE CROSS/BLUE SHIELD | Admitting: Nurse Practitioner

## 2018-11-01 VITALS — BP 123/82 | HR 70 | Temp 97.1°F | Ht 64.0 in

## 2018-11-01 DIAGNOSIS — R000 Tachycardia, unspecified: Secondary | ICD-10-CM | POA: Diagnosis not present

## 2018-11-01 DIAGNOSIS — Z3041 Encounter for surveillance of contraceptive pills: Secondary | ICD-10-CM | POA: Diagnosis not present

## 2018-11-01 DIAGNOSIS — D500 Iron deficiency anemia secondary to blood loss (chronic): Secondary | ICD-10-CM

## 2018-11-01 DIAGNOSIS — I10 Essential (primary) hypertension: Secondary | ICD-10-CM

## 2018-11-01 MED ORDER — METOPROLOL SUCCINATE ER 25 MG PO TB24: 25.0000 mg | ORAL_TABLET | Freq: Every day | ORAL | 3 refills | Status: AC

## 2018-11-01 MED ORDER — NORGESTIM-ETH ESTRAD TRIPHASIC 0.18/0.215/0.25 MG-25 MCG PO TABS: 1.0000 | ORAL_TABLET | Freq: Every day | ORAL | 3 refills | Status: AC

## 2018-11-01 NOTE — Assessment & Plan Note (Signed)
stable with metoprolol

## 2018-11-01 NOTE — Assessment & Plan Note (Signed)
Controlled with lisinopril HCTZ and metoprolol. BP Readings from Last 3 Encounters:  11/01/18 123/82  02/21/18 (!) 156/84  11/01/17 (!) 154/84   Repeat BMP. Continue current medications f/up with new pcp in 83months

## 2018-11-01 NOTE — Progress Notes (Signed)
Virtual Visit via Video Note  I connected with [name] on 11/01/18 at 11:00 AM EDT by a video enabled telemedicine application and verified that I am speaking with the correct person using two identifiers.   I discussed the limitations of evaluation and management by telemedicine and the availability of in person appointments. The patient expressed understanding and agreed to proceed.  CC: BP med refill/has BP log with her at home  History of Present Illness: HTN: Controled with lisinopril and metoprolol. Reports home readings at 121/82, 118/78, 122/78, 125/84 Denies any averse effects with medication. BP Readings from Last 3 Encounters:  11/01/18 123/82  02/21/18 (!) 156/84  11/01/17 (!) 154/84    Contraception maintenance: Use of OCp daily. Denies any adverse effects. Needs refills.  Anemia: Current use of ferrous sulfate daily. No constipation, no dizziness or palpitation or CP or SOB  Reports she has appt to establish care with new pcp due to change in her insurance plan.  Observations/Objective: Physical Exam  Constitutional: She is oriented to person, place, and time.  Pulmonary/Chest: Effort normal.  Neurological: She is alert and oriented to person, place, and time.  Psychiatric: She has a normal mood and affect. Her behavior is normal. Thought content normal.  Vitals reviewed.   Assessment and Plan: [name] was seen today for medication refill.  Diagnoses and all orders for this visit:  Essential hypertension, benign -     Basic metabolic panel; Future -     metoprolol succinate (TOPROL-XL) 25 MG 24 hr tablet; Take 1 tablet (25 mg total) by mouth at bedtime. Weaning off medication  Iron deficiency anemia due to chronic blood loss -     CBC w/Diff; Future -     IBC + Ferritin; Future  Encounter for surveillance of contraceptive pills -     Norgestimate-Ethinyl Estradiol Triphasic (TRI-LO-SPRINTEC) 0.18/0.215/0.25 MG-25 MCG tab; Take 1 tablet by mouth  daily.  Tachycardia   Follow Up Instructions: Return to lab on Monday for lab draw. Will send additional isinopril/HCTZ and ferrous sulfate after review of results. Maintain upcoming appt with new pcp in July.   I discussed the assessment and treatment plan with the patient. The patient was provided an opportunity to ask questions and all were answered. The patient agreed with the plan and demonstrated an understanding of the instructions.   The patient was advised to call back or seek an in-person evaluation if the symptoms worsen or if the condition fails to improve as anticipated.   Katrina Penna, NP

## 2018-11-01 NOTE — Assessment & Plan Note (Signed)
Repeat cbc and iron saturation.

## 2018-11-05 ENCOUNTER — Other Ambulatory Visit (INDEPENDENT_AMBULATORY_CARE_PROVIDER_SITE_OTHER): Payer: BLUE CROSS/BLUE SHIELD

## 2018-11-05 ENCOUNTER — Other Ambulatory Visit: Payer: Self-pay | Admitting: Nurse Practitioner

## 2018-11-05 DIAGNOSIS — D500 Iron deficiency anemia secondary to blood loss (chronic): Secondary | ICD-10-CM | POA: Diagnosis not present

## 2018-11-05 DIAGNOSIS — I10 Essential (primary) hypertension: Secondary | ICD-10-CM

## 2018-11-05 LAB — IBC + FERRITIN
Ferritin: 26.2 ng/mL (ref 10.0–291.0)
Iron: 106 ug/dL (ref 42–145)
Saturation Ratios: 30.8 % (ref 20.0–50.0)
Transferrin: 246 mg/dL (ref 212.0–360.0)

## 2018-11-05 LAB — BASIC METABOLIC PANEL
BUN: 9 mg/dL (ref 6–23)
CO2: 28 mEq/L (ref 19–32)
Calcium: 9.3 mg/dL (ref 8.4–10.5)
Chloride: 100 mEq/L (ref 96–112)
Creatinine, Ser: 0.67 mg/dL (ref 0.40–1.20)
GFR: 118.5 mL/min (ref >60.00)
Glucose, Bld: 89 mg/dL (ref 70–99)
Potassium: 3.5 mEq/L (ref 3.5–5.1)
Sodium: 137 mEq/L (ref 135–145)

## 2018-11-05 LAB — CBC WITH DIFFERENTIAL/PLATELET
Basophils Absolute: 0 10*3/uL (ref 0.0–0.1)
Basophils Relative: 0.4 % (ref 0.0–3.0)
Eosinophils Absolute: 0 10*3/uL (ref 0.0–0.7)
Eosinophils Relative: 0.9 % (ref 0.0–5.0)
HCT: 51.8 % — ABNORMAL HIGH (ref 36.0–46.0)
Hemoglobin: 17.7 g/dL — ABNORMAL HIGH (ref 12.0–15.0)
Lymphocytes Relative: 40.1 % (ref 12.0–46.0)
Lymphs Abs: 1.3 10*3/uL (ref 0.7–4.0)
MCHC: 34.1 g/dL (ref 30.0–36.0)
MCV: 88.6 fl (ref 78.0–100.0)
Monocytes Absolute: 0.3 10*3/uL (ref 0.1–1.0)
Monocytes Relative: 10.3 % (ref 3.0–12.0)
Neutro Abs: 1.6 10*3/uL (ref 1.4–7.7)
Neutrophils Relative %: 48.3 % (ref 43.0–77.0)
Platelets: 154 10*3/uL (ref 150.0–400.0)
RBC: 5.85 Mil/uL — ABNORMAL HIGH (ref 3.87–5.11)
RDW: 12.8 % (ref 11.5–15.5)
WBC: 3.2 10*3/uL — ABNORMAL LOW (ref 4.0–10.5)

## 2018-11-05 MED ORDER — LISINOPRIL-HYDROCHLOROTHIAZIDE 20-12.5 MG PO TABS: 1.0000 | ORAL_TABLET | Freq: Every day | ORAL | 3 refills | Status: AC

## 2019-11-29 ENCOUNTER — Other Ambulatory Visit: Payer: Self-pay | Admitting: Nurse Practitioner

## 2019-11-29 DIAGNOSIS — Z3041 Encounter for surveillance of contraceptive pills: Secondary | ICD-10-CM

## 2023-10-06 ENCOUNTER — Encounter (HOSPITAL_COMMUNITY): Payer: Self-pay

## 2023-10-06 ENCOUNTER — Other Ambulatory Visit: Payer: Self-pay

## 2023-10-06 ENCOUNTER — Emergency Department (HOSPITAL_COMMUNITY)
Admission: EM | Admit: 2023-10-06 | Discharge: 2023-10-07 | Disposition: A | Discharge status: Home / Self Care | Attending: Emergency Medicine | Admitting: Emergency Medicine

## 2023-10-06 DIAGNOSIS — S61213A Laceration without foreign body of left middle finger without damage to nail, initial encounter: Secondary | ICD-10-CM | POA: Insufficient documentation

## 2023-10-06 DIAGNOSIS — Y9389 Activity, other specified: Secondary | ICD-10-CM | POA: Insufficient documentation

## 2023-10-06 DIAGNOSIS — S6992XA Unspecified injury of left wrist, hand and finger(s), initial encounter: Secondary | ICD-10-CM | POA: Diagnosis present

## 2023-10-06 DIAGNOSIS — Z23 Encounter for immunization: Secondary | ICD-10-CM | POA: Insufficient documentation

## 2023-10-06 DIAGNOSIS — Z79899 Other long term (current) drug therapy: Secondary | ICD-10-CM | POA: Insufficient documentation

## 2023-10-06 DIAGNOSIS — W268XXA Contact with other sharp object(s), not elsewhere classified, initial encounter: Secondary | ICD-10-CM | POA: Insufficient documentation

## 2023-10-06 DIAGNOSIS — I10 Essential (primary) hypertension: Secondary | ICD-10-CM | POA: Insufficient documentation

## 2023-10-06 DIAGNOSIS — S61219A Laceration without foreign body of unspecified finger without damage to nail, initial encounter: Secondary | ICD-10-CM

## 2023-10-06 NOTE — ED Triage Notes (Addendum)
 Patient reports she was cutting bell pepper making dinner and cut the tip of her left middle finger and had a hard time getting it to stop bleeding. Patient is right handed. Laceration is not bleeding at this time, does not appear to involve bone or nail bed and CNS is intact distal to injury. Patient believes last tetanus booster was 11+ years ago.

## 2023-10-07 MED ORDER — TETANUS-DIPHTH-ACELL PERTUSSIS 5-2.5-18.5 LF-MCG/0.5 IM SUSY
0.5000 mL | PREFILLED_SYRINGE | Freq: Once | INTRAMUSCULAR | Status: AC
  Administered 2023-10-07: 0.5 mL via INTRAMUSCULAR
  Filled 2023-10-07: qty 0.5

## 2023-10-07 NOTE — ED Provider Notes (Addendum)
 [place] EMERGENCY DEPARTMENT AT [facility] Provider Note   CSN: [id number] Arrival date & time: 10/06/23  2043     History  Chief Complaint  Patient presents with   Finger Laceration    [name] is a 43 y.o. female history of hypertension, anemia presented for cut to her left middle finger.  Patient states that she was cutting bell pepper last night when she sliced her finger.  Patient states the bleeding is now controlled and she is not on any blood thinners and can still feel and move her finger and denies any weakness or paresthesias or skin color changes.  Patient is unsure of last tetanus but thinks it may have been over a decade ago.    Home Medications Prior to Admission medications   Medication Sig Start Date End Date Taking? Authorizing Provider  Acetaminophen (TYLENOL PO) Take by mouth.    [provider]  cetirizine (ZYRTEC) 10 MG tablet Take 10 mg by mouth daily as needed for allergies. Reported on 01/21/2016    [provider]  diphenhydrAMINE (BENADRYL) 50 MG capsule Take 50 mg by mouth every 6 (six) hours as needed for allergies. Reported on 01/21/2016    [provider]  ferrous sulfate 325 (65 FE) MG tablet Take 1 tablet (325 mg total) by mouth daily after supper. 11/01/17   Nche, Bonna Gains, NP  Fexofenadine HCl (ALLEGRA PO) Take by mouth.    [provider]  ibuprofen (ADVIL,MOTRIN) 600 MG tablet Take 1 tablet (600 mg total) by mouth every 6 (six) hours as needed for pain. 06/07/12   Waynard Reeds, MD  lisinopril-hydrochlorothiazide (ZESTORETIC) 20-12.5 MG tablet Take 1 tablet by mouth daily. 11/05/18   Nche, Bonna Gains, NP  metoprolol succinate (TOPROL-XL) 25 MG 24 hr tablet Take 1 tablet (25 mg total) by mouth at bedtime. Weaning off medication 11/01/18   Nche, Bonna Gains, NP  Norgestimate-Ethinyl Estradiol Triphasic (TRI-LO-SPRINTEC) 0.18/0.215/0.25 MG-25 MCG tab Take 1 tablet by mouth daily. 11/01/18   Nche,  Bonna Gains, NP      Allergies    Patient has no known allergies.    Review of Systems   Review of Systems  Physical Exam Updated Vital Signs BP (!) 168/93 (BP Location: Left Arm)   Pulse 66   Temp 98.5 F (36.9 C)   Resp 18   Ht 5\' 3"  (1.6 m)   Wt 94.3 kg   SpO2 100%   BMI 36.85 kg/m  Physical Exam Constitutional:      General: She is not in acute distress. Cardiovascular:     Rate and Rhythm: Normal rate and regular rhythm.     Pulses: Normal pulses.  Musculoskeletal:        General: Normal range of motion.  Skin:    General: Skin is warm and dry.     Capillary Refill: Capillary refill takes less than 2 seconds.     Comments: 1 cm laceration noted to medial aspect of left middle finger without damage to the nailbed area, extremely superficial in nature with well-approximated edges without skin color changes warmth or hemorrhage  Neurological:     Mental Status: She is alert.     Comments: Sensation intact distally    ED Results / Procedures / Treatments   Labs (all labs ordered are listed, but only abnormal results are displayed) Labs Reviewed - No data to display  EKG None  Radiology No results found.  Procedures .Laceration Repair  Date/Time: 10/07/2023  6:20 AM  Performed by: Netta Corrigan, PA-C Authorized by: Netta Corrigan, PA-C   Consent:    Consent obtained:  Verbal   Consent given by:  Patient   Risks, benefits, and alternatives were discussed: yes     Risks discussed:  Infection, nerve damage, need for additional repair, pain, poor cosmetic result, poor wound healing, tendon damage, retained foreign body and vascular damage Universal protocol:    Procedure explained and questions answered to patient or proxy's satisfaction: yes     Immediately prior to procedure, a time out was called: yes     Patient identity confirmed:  Verbally with patient Anesthesia:    Anesthesia method:  None Laceration details:    Location:  Finger   Finger  location:  L long finger   Length (cm):  1   Depth (mm):  1 Treatment:    Area cleansed with:  Saline   Amount of cleaning:  Standard   Irrigation solution:  Sterile saline   Irrigation volume:  500 mL   Irrigation method:  Pressure wash   Visualized foreign bodies/material removed: no     Debridement:  None Skin repair:    Repair method:  Tissue adhesive Approximation:    Approximation:  Close Repair type:    Repair type:  Simple Post-procedure details:    Dressing:  Open (no dressing)   Procedure completion:  Tolerated     Medications Ordered in ED Medications  Tdap (BOOSTRIX) injection 0.5 mL (has no administration in time range)    ED Course/ Medical Decision Making/ A&P                                 Medical Decision Making Risk Prescription drug management.   Karlisha L Woolston 43 y.o. presented today for a laceration. Working DDx that I considered at this time includes, but not limited to, FB, fracture, NV compromise, simple laceration.  R/o DDx: FB, fracture, NV compromise: These are considered less likely due to history of present illness and physical exam findings  Review of prior external notes: 09/04/2023 office visit  Unique Tests and My Independent Interpretation: None  Social Determinants of Health: none  Discussion with Independent Historian: None  Discussion of Management of Tests: None  Risk: Medium: prescription drug management  Risk Stratification Score: None  Plan: Patient is in no acute distress and has stable vitals. They are neurovascularly intact. Tetanus is out of date and so we will update this. Patient is in no distress. Laceration will be repaired with standard wound care procedures with Dermabond as there is a very small cut that is well-approximated.  Wound is less than 12 hours old on the body and can repair.  Patient/family educated about specific return precautions for given chief complaint and symptoms.  Patient/family  educated about follow-up with PCP. Laceration repaired without complication and with pulse, motor, sensation intact.  Patient is stable for discharge at this time. Patient/family expressed understanding of return precautions and need for follow-up. Patient spoken to regarding appropriate follow up for these results. All education provided in verbal form with additional information in written form. Time was allowed for answering of patient questions. Patient discharged.   This chart was dictated using voice recognition software.  Despite best efforts to proofread,  errors can occur which can change the documentation meaning.        Final Clinical Impression(s) / ED Diagnoses Final diagnoses:  Cut of finger    Rx / DC Orders ED Discharge Orders     None         Remi Deter 10/07/23 [id number]    Netta Corrigan, PA-C 10/07/23 [id number]    Zadie Rhine, MD 10/07/23 [phone]

## 2023-10-07 NOTE — Discharge Instructions (Signed)
 Please follow-up with a primary care provider regards recent symptoms and ER visit.  Today your exam was reassuring and we placed Dermabond.  Please keep the wound clean and dry and once the Dermabond is gone you may use Neosporin ointment.  Please use Tylenol every 6 hours as needed for pain.  Your tetanus was also updated as well.  If symptoms change or worsen please return to the ER.
